# Patient Record
Sex: Male | Born: 1960
Health system: Southern US, Community
[De-identification: ages and names within clinical notes are randomized; demographics above are authoritative.]

## PROBLEM LIST (undated history)

## (undated) DIAGNOSIS — G4700 Insomnia, unspecified: Secondary | ICD-10-CM

## (undated) DIAGNOSIS — E559 Vitamin D deficiency, unspecified: Secondary | ICD-10-CM

## (undated) DIAGNOSIS — E538 Deficiency of other specified B group vitamins: Secondary | ICD-10-CM

## (undated) DIAGNOSIS — I10 Essential (primary) hypertension: Secondary | ICD-10-CM

## (undated) DIAGNOSIS — K219 Gastro-esophageal reflux disease without esophagitis: Secondary | ICD-10-CM

## (undated) HISTORY — PX: BACK SURGERY: SHX140

## (undated) HISTORY — DX: Gastro-esophageal reflux disease without esophagitis: K21.9

## (undated) HISTORY — DX: Vitamin D deficiency, unspecified: E55.9

## (undated) HISTORY — DX: Insomnia, unspecified: G47.00

## (undated) HISTORY — DX: Essential (primary) hypertension: I10

## (undated) HISTORY — DX: Deficiency of other specified B group vitamins: E53.8

---

## 2003-12-07 ENCOUNTER — Ambulatory Visit: Payer: Self-pay | Admitting: Family Medicine

## 2005-02-26 ENCOUNTER — Ambulatory Visit: Payer: Self-pay | Admitting: Internal Medicine

## 2005-03-01 ENCOUNTER — Ambulatory Visit: Payer: Self-pay | Admitting: Internal Medicine

## 2006-03-15 ENCOUNTER — Ambulatory Visit: Payer: Self-pay | Admitting: Internal Medicine

## 2006-07-13 ENCOUNTER — Ambulatory Visit: Payer: Self-pay | Admitting: Internal Medicine

## 2006-07-25 ENCOUNTER — Ambulatory Visit: Payer: Self-pay | Admitting: Internal Medicine

## 2006-07-25 LAB — CONVERTED CEMR LAB
ALT: 20 units/L (ref 0–53)
AST: 21 units/L (ref 0–37)
Basophils Relative: 0.8 % (ref 0.0–1.0)
Bilirubin, Direct: 0.1 mg/dL (ref 0.0–0.3)
Calcium: 9.1 mg/dL (ref 8.4–10.5)
Chloride: 101 meq/L (ref 96–112)
Cholesterol: 195 mg/dL (ref 0–200)
Creatinine, Ser: 1 mg/dL (ref 0.4–1.5)
Eosinophils Absolute: 0.1 10*3/uL (ref 0.0–0.6)
Eosinophils Relative: 1.9 % (ref 0.0–5.0)
GFR calc non Af Amer: 86 mL/min
Glucose, Bld: 96 mg/dL (ref 70–99)
HCT: 44.7 % (ref 39.0–52.0)
Ketones, ur: NEGATIVE mg/dL
LDL Cholesterol: 142 mg/dL — ABNORMAL HIGH (ref 0–99)
MCV: 88.6 fL (ref 78.0–100.0)
Neutrophils Relative %: 46.1 % (ref 43.0–77.0)
Nitrite: NEGATIVE
Platelets: 259 10*3/uL (ref 150–400)
RBC: 5.05 M/uL (ref 4.22–5.81)
RDW: 14.2 % (ref 11.5–14.6)
Sodium: 139 meq/L (ref 135–145)
Specific Gravity, Urine: 1.02 (ref 1.000–1.03)
Total Bilirubin: 1 mg/dL (ref 0.3–1.2)
Total CHOL/HDL Ratio: 5.3
Total Protein, Urine: NEGATIVE mg/dL
Urine Glucose: NEGATIVE mg/dL
Urobilinogen, UA: 0.2 (ref 0.0–1.0)
WBC: 5.1 10*3/uL (ref 4.5–10.5)
pH: 6 (ref 5.0–8.0)

## 2006-08-21 ENCOUNTER — Emergency Department (HOSPITAL_COMMUNITY): Admission: EM | Admit: 2006-08-21 | Discharge: 2006-08-21 | Payer: Self-pay | Admitting: Emergency Medicine

## 2007-06-21 ENCOUNTER — Encounter: Payer: Self-pay | Admitting: *Deleted

## 2007-06-21 DIAGNOSIS — G4700 Insomnia, unspecified: Secondary | ICD-10-CM | POA: Insufficient documentation

## 2009-01-19 ENCOUNTER — Ambulatory Visit: Payer: Self-pay | Admitting: Internal Medicine

## 2009-01-19 DIAGNOSIS — I10 Essential (primary) hypertension: Secondary | ICD-10-CM | POA: Insufficient documentation

## 2009-01-19 DIAGNOSIS — Z87891 Personal history of nicotine dependence: Secondary | ICD-10-CM | POA: Insufficient documentation

## 2009-01-24 LAB — CONVERTED CEMR LAB
BUN: 12 mg/dL (ref 6–23)
Bilirubin Urine: NEGATIVE
Bilirubin, Direct: 0.1 mg/dL (ref 0.0–0.3)
CO2: 27 meq/L (ref 19–32)
Calcium: 9.2 mg/dL (ref 8.4–10.5)
Chloride: 106 meq/L (ref 96–112)
Cholesterol: 186 mg/dL (ref 0–200)
Creatinine, Ser: 0.9 mg/dL (ref 0.4–1.5)
Eosinophils Absolute: 0.1 10*3/uL (ref 0.0–0.7)
HDL: 39.4 mg/dL (ref >39.00)
Hemoglobin, Urine: NEGATIVE
Ketones, ur: NEGATIVE mg/dL
LDL Cholesterol: 126 mg/dL — ABNORMAL HIGH (ref 0–99)
Lymphocytes Relative: 35.1 % (ref 12.0–46.0)
MCHC: 33.3 g/dL (ref 30.0–36.0)
MCV: 92.4 fL (ref 78.0–100.0)
Monocytes Absolute: 0.6 10*3/uL (ref 0.1–1.0)
Neutrophils Relative %: 49.4 % (ref 43.0–77.0)
Platelets: 227 10*3/uL (ref 150.0–400.0)
Specific Gravity, Urine: >=1.03 (ref 1.000–1.030)
Total Bilirubin: 1 mg/dL (ref 0.3–1.2)
Triglycerides: 102 mg/dL (ref 0.0–149.0)
Urine Glucose: NEGATIVE mg/dL
Urobilinogen, UA: 0.2 (ref 0.0–1.0)
VLDL: 20.4 mg/dL (ref 0.0–40.0)
WBC: 4.6 10*3/uL (ref 4.5–10.5)

## 2009-09-05 ENCOUNTER — Telehealth: Payer: Self-pay | Admitting: Internal Medicine
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
## 2010-02-14 NOTE — Assessment & Plan Note (Signed)
Summary: cpx / uhc/#/cd   Vital Signs:  Patient profile:   50 year old male Height:      72 inches Weight:      223 pounds BMI:     30.35 Temp:     98.5 degrees F oral Pulse rate:   69 / minute BP sitting:   116 / 88  (left arm)  Vitals Entered By: Tora Perches (January 19, 2009 8:42 AM) CC: cpx Is Patient Diabetic? No Comments pt states he takes bloodpressure med but he does not no the name./vg   CC:  cpx.  History of Present Illness: The patient presents for a wellness examination. C/o occasional R shoulder pain - he sleeps on the floor  Preventive Screening-Counseling & Management  Alcohol-Tobacco     Smoking Status: quit  Caffeine-Diet-Exercise     Does Patient Exercise: no  Current Medications (verified): 1)  Multivitamins  Tabs (Multiple Vitamin) .... Once Daily  Allergies (verified): 1)  Lisinopril (Lisinopril)  Past History:  Past Medical History: INSOMNIA (ICD-780.52)   Hypertension - he has a friend  Card in [place] [facility] in 2010  Past Surgical History: Denies surgical history  Family History: HTN  Social History: Occupation: Married Regular exercise-no - tennis Former Smoker Does Patient Exercise:  no  Review of Systems  The patient denies anorexia, fever, weight loss, weight gain, vision loss, decreased hearing, hoarseness, chest pain, syncope, dyspnea on exertion, peripheral edema, prolonged cough, headaches, hemoptysis, abdominal pain, melena, hematochezia, severe indigestion/heartburn, hematuria, incontinence, genital sores, muscle weakness, suspicious skin lesions, transient blindness, difficulty walking, depression, unusual weight change, abnormal bleeding, enlarged lymph nodes, angioedema, and testicular masses.    Physical Exam  General:  Well-developed,well-nourished,in no acute distress; alert,appropriate and cooperative throughout examination Head:  Normocephalic and atraumatic without obvious abnormalities. No apparent alopecia or  balding. Eyes:  No corneal or conjunctival inflammation noted. EOMI. Perrla. Ears:  External ear exam shows no significant lesions or deformities.  Otoscopic examination reveals clear canals, tympanic membranes are intact bilaterally without bulging, retraction, inflammation or discharge. Hearing is grossly normal bilaterally. Nose:  External nasal examination shows no deformity or inflammation. Nasal mucosa are pink and moist without lesions or exudates. Mouth:  Oral mucosa and oropharynx without lesions or exudates.  Teeth in good repair. Neck:  No deformities, masses, or tenderness noted. Lungs:  Normal respiratory effort, chest expands symmetrically. Lungs are clear to auscultation, no crackles or wheezes. Heart:  Normal rate and regular rhythm. S1 and S2 normal without gallop, murmur, click, rub or other extra sounds. Abdomen:  Bowel sounds positive,abdomen soft and non-tender without masses, organomegaly or hernias noted. Genitalia:  Test nl B Msk:  No deformity or scoliosis noted of thoracic or lumbar spine.   Extremities:  No clubbing, cyanosis, edema, or deformity noted with normal full range of motion of all joints.   Neurologic:  No cranial nerve deficits noted. Station and gait are normal. Plantar reflexes are down-going bilaterally. DTRs are symmetrical throughout. Sensory, motor and coordinative functions appear intact. Skin:  Intact without suspicious lesions or rashes Cervical Nodes:  No lymphadenopathy noted Inguinal Nodes:  No significant adenopathy Psych:  Cognition and judgment appear intact. Alert and cooperative with normal attention span and concentration. No apparent delusions, illusions, hallucinations   Impression & Recommendations:  Problem # 1:  Preventive Health Care (ICD-V70.0) Assessment Comment Only Health and age related issues were discussed. Available screening tests and vaccinations were discussed as well. Healthy life style including good diet and  execise  was discussed. The labs were reviewed with the patient.  Problem # 2:  HYPERTENSION (ICD-401.9) Assessment: Improved  His updated medication list for this problem includes:    Amlodipine Besylate 5 Mg Tabs (Amlodipine besylate) .Marland Kitchen... 1 by mouth qd  Problem # 3:  INSOMNIA (ICD-780.52) Assessment: Improved  The following medications were removed from the medication list:    Ambien 10 Mg Tabs (Zolpidem tartrate) .Marland Kitchen... Take 1/2 at bedtime as needed  Complete Medication List: 1)  Multivitamins Tabs (Multiple vitamin) .... Once daily 2)  Amlodipine Besylate 5 Mg Tabs (Amlodipine besylate) .Marland Kitchen.. 1 by mouth qd 3)  Aspirin 81 Mg Tbec (Aspirin) .Marland Kitchen.. 1 by mouth qd 4)  Vitamin D 1000 Unit Tabs (Cholecalciferol) .Marland Kitchen.. 1 by mouth qd  Other Orders: Admin 1st Vaccine (16109) Flu Vaccine 25yrs + (60454)  Patient Instructions: 1)  Please schedule a follow-up appointment in 1 year well w/labs. 2)  Memory foam matress pad and a contour pillow 3)  Use stretching exercises that I have provided (15 min. or longer every day) Prescriptions: AMLODIPINE BESYLATE 5 MG TABS (AMLODIPINE BESYLATE) 1 by mouth qd  #30 x 12   Entered and Authorized by:   Tresa Garter MD   Signed by:   Tora Perches on 01/19/2009   Method used:   Print then Give to Patient   RxID:   [id number]    Influenza Vaccine (to be given today)    Flu Vaccine Consent Questions     Do you have a history of severe allergic reactions to this vaccine? no    Any prior history of allergic reactions to egg and/or gelatin? no    Do you have a sensitivity to the preservative Thimersol? no    Do you have a past history of Guillan-Barre Syndrome? no    Do you currently have an acute febrile illness? no    Have you ever had a severe reaction to latex? no    Vaccine information given and explained to patient? yes    Are you currently pregnant? no    Lot Number:[id number]   Exp Date:07/14/2009   Site Given  Left Deltoid IMbflu

## 2010-05-30 NOTE — Assessment & Plan Note (Signed)
[facility]                           PRIMARY CARE OFFICE NOTE   NOBLE, CICALESE                      MRN:          [id number]  DATE:07/25/2006                            DOB:          1960-07-30    The patient is a 50 year old male who presents for a wellness  examination.   PAST MEDICAL HISTORY:  As per March 01, 2005 note.   FAMILY HISTORY:  As per March 01, 2005 note.   SOCIAL HISTORY:  As per March 01, 2005 note.   CURRENT MEDICATIONS:  None.   ALLERGIES:  None.   REVIEW OF SYSTEMS:  No chest pain or shortness of breath. No syncope. No  neurologic complaints. He has been stressed out lately with job  insecurity and others. Not depressed. The rest of 18 point review of  system is negative.   PHYSICAL EXAMINATION:  He looks well. Blood pressure 148/101, recheck  163/100. He is in no acute distress. Looks well.  HEENT: With moist mucosa. Neck is supple. No thyromegaly or bruit.  LUNGS:  Clear to auscultation and percussion. No wheezing or rales.  HEART: S1, S2. No murmur. No gallop.  ABDOMEN: Soft and nontender. No organomegaly or mass felt.  EXTREMITIES: Lower extremities without edema.  NEURO: He is alert and cooperative. Denies being depressed.  Normal external genitalia. No hernias, testicles free of masses.   LABORATORY DATA:  EKG with normal sinus rhythm.   ASSESSMENT/PLAN:  1. Normal wellness examination.Age/health-related issues discussed.      Healthy lifestyle discussed. Obtain lab work appropriate for age.      Stress reduction discussed. Repeat examination in 12 months.  2. Stress. Discussed. He is managing okay.  3. Insomnia related to the above. I gave him Ambien 10 mg at h.s. one-      half to one p.r.n. Risks and benefits discussed.     Georgina Quint. Plotnikov, MD  Electronically Signed   AVP/MedQ  DD: 07/30/2006  DT: 07/30/2006  Job #: [id number]

## 2010-07-13 ENCOUNTER — Telehealth: Payer: Self-pay

## 2010-07-17 ENCOUNTER — Telehealth: Payer: Self-pay | Admitting: Internal Medicine

## 2010-07-17 ENCOUNTER — Ambulatory Visit (INDEPENDENT_AMBULATORY_CARE_PROVIDER_SITE_OTHER)
Admission: RE | Admit: 2010-07-17 | Discharge: 2010-07-17 | Disposition: A | Discharge status: Home / Self Care | Payer: 59 | Source: Ambulatory Visit | Attending: Internal Medicine | Admitting: Internal Medicine

## 2010-07-17 ENCOUNTER — Ambulatory Visit (INDEPENDENT_AMBULATORY_CARE_PROVIDER_SITE_OTHER): Payer: 59 | Admitting: Internal Medicine

## 2010-07-17 ENCOUNTER — Encounter: Payer: Self-pay | Admitting: Internal Medicine

## 2010-07-17 VITALS — BP 140/90 | HR 72 | Temp 97.8°F | Resp 16 | Ht 71.5 in | Wt 225.0 lb

## 2010-07-17 DIAGNOSIS — M545 Low back pain, unspecified: Secondary | ICD-10-CM

## 2010-07-17 DIAGNOSIS — M79605 Pain in left leg: Secondary | ICD-10-CM

## 2010-07-17 MED ORDER — PREDNISONE 10 MG PO TABS: ORAL_TABLET | ORAL | Status: AC

## 2010-07-17 MED ORDER — HYDROCODONE-ACETAMINOPHEN 7.5-325 MG PO TABS: 1.0000 | ORAL_TABLET | Freq: Four times a day (QID) | ORAL | Status: AC | PRN

## 2010-07-17 NOTE — Telephone Encounter (Signed)
Andrew Rowland, please, inform patient that lower spine xray normal except for DJD Thx

## 2010-07-17 NOTE — Progress Notes (Signed)
  Subjective:    Patient ID: Andrew Rowland, male    DOB: [birth year], 50 y.o.   MRN: [id number]  HPI  C/o LBP severe x 3 wks 6-7/10; worse w/activity - B sides He took Skelaxin and Relafen - it helped. 3 d ago pain started to go down L leg to mid-calf. No rash   Review of Systems  Constitutional: Negative for appetite change, fatigue and unexpected weight change.  HENT: Negative for nosebleeds, congestion, sore throat, sneezing, trouble swallowing and neck pain.   Eyes: Negative for itching and visual disturbance.  Respiratory: Negative for cough.   Cardiovascular: Negative for chest pain, palpitations and leg swelling.  Gastrointestinal: Negative for nausea, diarrhea, blood in stool and abdominal distention.  Genitourinary: Negative for frequency, hematuria and decreased urine volume.  Musculoskeletal: Positive for back pain and gait problem. Negative for myalgias, joint swelling and arthralgias.  Skin: Negative for rash.  Neurological: Negative for dizziness, tremors, speech difficulty and weakness.  Psychiatric/Behavioral: Negative for sleep disturbance, dysphoric mood and agitation. The patient is not nervous/anxious.        Objective:   Physical Exam  Constitutional: He is oriented to person, place, and time. He appears well-developed.  HENT:  Mouth/Throat: Oropharynx is clear and moist.  Eyes: Conjunctivae are normal. Pupils are equal, round, and reactive to light.  Neck: Normal range of motion. No JVD present. No thyromegaly present.  Cardiovascular: Normal rate, regular rhythm, normal heart sounds and intact distal pulses.  Exam reveals no gallop and no friction rub.   No murmur heard. Pulmonary/Chest: Effort normal and breath sounds normal. No respiratory distress. He has no wheezes. He has no rales. He exhibits no tenderness.  Abdominal: Soft. Bowel sounds are normal. He exhibits no distension and no mass. There is no tenderness. There is no rebound and no guarding.    Musculoskeletal: Normal range of motion. He exhibits tenderness (LS is tender L>R). He exhibits no edema.  Lymphadenopathy:    He has no cervical adenopathy.  Neurological: He is alert and oriented to person, place, and time. He has normal reflexes. He displays normal reflexes. No cranial nerve deficit. He exhibits normal muscle tone. Coordination normal.       Str leg elev is pos on L  Skin: Skin is warm and dry. No rash noted.  Psychiatric: He has a normal mood and affect. His behavior is normal. Judgment and thought content normal.          Assessment & Plan:

## 2010-07-17 NOTE — Patient Instructions (Signed)
Stretch your lower back

## 2010-07-18 NOTE — Telephone Encounter (Signed)
Pt informed

## 2010-07-20 NOTE — Telephone Encounter (Signed)
error 

## 2010-07-24 ENCOUNTER — Encounter: Payer: Self-pay | Admitting: Internal Medicine

## 2010-07-24 DIAGNOSIS — M79605 Pain in left leg: Secondary | ICD-10-CM | POA: Insufficient documentation

## 2010-07-24 DIAGNOSIS — M545 Low back pain, unspecified: Secondary | ICD-10-CM | POA: Insufficient documentation

## 2010-07-24 DIAGNOSIS — M5416 Radiculopathy, lumbar region: Secondary | ICD-10-CM | POA: Insufficient documentation

## 2010-07-24 NOTE — Assessment & Plan Note (Signed)
Severe. Prednisone 10 mg: take 4 tabs a day x 3 days; then 3 tabs a day x 4 days; then 2 tabs a day x 4 days, then 1 tab a day x 6 days, then stop. Take pc. See other meds and orders

## 2010-08-09 ENCOUNTER — Encounter: Payer: Self-pay | Admitting: Internal Medicine

## 2010-08-09 ENCOUNTER — Ambulatory Visit (INDEPENDENT_AMBULATORY_CARE_PROVIDER_SITE_OTHER): Payer: 59 | Admitting: Internal Medicine

## 2010-08-09 VITALS — BP 140/108 | HR 96 | Temp 98.4°F | Resp 16 | Ht 71.5 in | Wt 221.0 lb

## 2010-08-09 DIAGNOSIS — I10 Essential (primary) hypertension: Secondary | ICD-10-CM

## 2010-08-09 DIAGNOSIS — R519 Headache, unspecified: Secondary | ICD-10-CM | POA: Insufficient documentation

## 2010-08-09 DIAGNOSIS — R51 Headache: Secondary | ICD-10-CM

## 2010-08-09 DIAGNOSIS — M545 Low back pain, unspecified: Secondary | ICD-10-CM

## 2010-08-09 DIAGNOSIS — M79605 Pain in left leg: Secondary | ICD-10-CM

## 2010-08-09 DIAGNOSIS — Z0000 Encounter for general adult medical examination without abnormal findings: Secondary | ICD-10-CM

## 2010-08-09 MED ORDER — AMLODIPINE BESYLATE-VALSARTAN 5-160 MG PO TABS: 1.0000 | ORAL_TABLET | Freq: Every day | ORAL | Status: DC

## 2010-08-09 NOTE — Progress Notes (Signed)
  Subjective:    Patient ID: Andrew Rowland, male    DOB: [birth year], 50 y.o.   MRN: [id number]  HPI  F/u LBP much better F/u elev BP He had a HA - dull x 2 d  Review of Systems  Constitutional: Negative for appetite change, fatigue and unexpected weight change.  HENT: Negative for nosebleeds, congestion, sore throat, sneezing, trouble swallowing and neck pain.   Eyes: Negative for itching and visual disturbance.  Respiratory: Negative for cough.   Cardiovascular: Negative for chest pain, palpitations and leg swelling.  Gastrointestinal: Negative for nausea, diarrhea, blood in stool and abdominal distention.  Genitourinary: Negative for frequency and hematuria.  Musculoskeletal: Positive for back pain (much better). Negative for joint swelling and gait problem.  Skin: Negative for rash.  Neurological: Negative for dizziness, tremors, speech difficulty and weakness.  Psychiatric/Behavioral: Negative for sleep disturbance, dysphoric mood and agitation. The patient is not nervous/anxious.        Objective:   Physical Exam  Constitutional: He is oriented to person, place, and time. He appears well-developed.  HENT:  Mouth/Throat: Oropharynx is clear and moist.  Eyes: Conjunctivae are normal. Pupils are equal, round, and reactive to light.  Neck: Normal range of motion. No JVD present. No thyromegaly present.  Cardiovascular: Normal rate, regular rhythm, normal heart sounds and intact distal pulses.  Exam reveals no gallop and no friction rub.   No murmur heard. Pulmonary/Chest: Effort normal and breath sounds normal. No respiratory distress. He has no wheezes. He has no rales. He exhibits no tenderness.  Abdominal: Soft. Bowel sounds are normal. He exhibits no distension and no mass. There is no tenderness. There is no rebound and no guarding.  Musculoskeletal: Normal range of motion. He exhibits no edema and no tenderness.  Lymphadenopathy:    He has no cervical adenopathy.    Neurological: He is alert and oriented to person, place, and time. He has normal reflexes. No cranial nerve deficit. He exhibits normal muscle tone. Coordination normal.  Skin: Skin is warm and dry. No rash noted.  Psychiatric: He has a normal mood and affect. His behavior is normal. Judgment and thought content normal.          Assessment & Plan:

## 2010-08-09 NOTE — Assessment & Plan Note (Signed)
Change to Exforge if elevated

## 2010-08-09 NOTE — Assessment & Plan Note (Signed)
New saddle for bike MRI if re-occurs Glucosamine to try

## 2010-08-09 NOTE — Patient Instructions (Addendum)
If BP > 140/95 start Exforge 160/5 a day

## 2010-08-09 NOTE — Assessment & Plan Note (Signed)
See meds 

## 2010-09-30 ENCOUNTER — Encounter: Payer: Self-pay | Admitting: Family Medicine

## 2010-09-30 ENCOUNTER — Ambulatory Visit (INDEPENDENT_AMBULATORY_CARE_PROVIDER_SITE_OTHER): Payer: 59 | Admitting: Family Medicine

## 2010-09-30 VITALS — BP 110/78 | HR 68 | Temp 98.7°F | Wt 216.8 lb

## 2010-09-30 DIAGNOSIS — M545 Low back pain, unspecified: Secondary | ICD-10-CM

## 2010-09-30 MED ORDER — PREDNISONE 10 MG PO TABS: ORAL_TABLET | ORAL | Status: DC

## 2010-09-30 NOTE — Progress Notes (Signed)
  Subjective:    Patient ID: Andrew Rowland, male    DOB: 04/26/1960, 50 y.o.   MRN: [id number]  HPI Recurrent low back pain. Was doing well until 2 days ago when he tried new exercise program. Now has recurrent left lower back pain radiating down left lower extremity. No incontinence. No weakness. Pain is moderate. Using hydrocodone with some relief. Previously prescribed prednisone which seemed to help. Previous x-rays revealed degenerative changes mostly L5-S1.   Review of Systems  Constitutional: Negative for fever, appetite change and unexpected weight change.  Cardiovascular: Negative for chest pain.  Gastrointestinal: Negative for abdominal pain.  Genitourinary: Negative for dysuria.  Neurological: Negative for weakness and numbness.       Objective:   Physical Exam  Constitutional: He appears well-developed and well-nourished.  Cardiovascular: Normal rate and regular rhythm.   Pulmonary/Chest: Effort normal and breath sounds normal. No respiratory distress. He has no wheezes. He has no rales.  Musculoskeletal: He exhibits no edema.       Straight leg raise is negative. No leg edema  Neurological:       Deep tendon reflexes 2+ knee and ankle bilaterally. Normal sensory function. Strength full lower extremities          Assessment & Plan:  Recurrent low back pain with left radiculopathy symptoms. Nonfocal neurologic exam. Prescription for repeat prednisone taper. Continue Vicodin as needed for pain relief. Consider physical therapy. Patient may need MRI to further assess. Followup with primary if symptoms persist

## 2010-09-30 NOTE — Patient Instructions (Signed)
Consider physical therapy Followup with primary physician if pain persists

## 2011-04-04 ENCOUNTER — Encounter: Payer: Self-pay | Admitting: Internal Medicine

## 2011-04-04 ENCOUNTER — Ambulatory Visit (INDEPENDENT_AMBULATORY_CARE_PROVIDER_SITE_OTHER)
Admission: RE | Admit: 2011-04-04 | Discharge: 2011-04-04 | Disposition: A | Discharge status: Home / Self Care | Payer: 59 | Source: Ambulatory Visit | Attending: Internal Medicine | Admitting: Internal Medicine

## 2011-04-04 ENCOUNTER — Ambulatory Visit (INDEPENDENT_AMBULATORY_CARE_PROVIDER_SITE_OTHER): Payer: 59 | Admitting: Internal Medicine

## 2011-04-04 ENCOUNTER — Other Ambulatory Visit (INDEPENDENT_AMBULATORY_CARE_PROVIDER_SITE_OTHER): Payer: 59

## 2011-04-04 ENCOUNTER — Telehealth: Payer: Self-pay | Admitting: Internal Medicine

## 2011-04-04 VITALS — BP 130/90 | HR 80 | Temp 98.8°F | Resp 16 | Wt 217.0 lb

## 2011-04-04 DIAGNOSIS — M25473 Effusion, unspecified ankle: Secondary | ICD-10-CM

## 2011-04-04 DIAGNOSIS — M25471 Effusion, right ankle: Secondary | ICD-10-CM

## 2011-04-04 DIAGNOSIS — M545 Low back pain, unspecified: Secondary | ICD-10-CM

## 2011-04-04 DIAGNOSIS — I10 Essential (primary) hypertension: Secondary | ICD-10-CM

## 2011-04-04 DIAGNOSIS — M79605 Pain in left leg: Secondary | ICD-10-CM

## 2011-04-04 DIAGNOSIS — M25579 Pain in unspecified ankle and joints of unspecified foot: Secondary | ICD-10-CM

## 2011-04-04 LAB — BASIC METABOLIC PANEL
BUN: 16 mg/dL (ref 6–23)
CO2: 29 mEq/L (ref 19–32)
Chloride: 106 mEq/L (ref 96–112)
GFR: 87.68 mL/min (ref >60.00)
Glucose, Bld: 90 mg/dL (ref 70–99)
Potassium: 4.6 mEq/L (ref 3.5–5.1)
Sodium: 140 mEq/L (ref 135–145)

## 2011-04-04 LAB — SEDIMENTATION RATE: Sed Rate: 4 mm/hr (ref 0–22)

## 2011-04-04 MED ORDER — MELOXICAM 15 MG PO TABS: 15.0000 mg | ORAL_TABLET | Freq: Every day | ORAL | Status: DC | PRN

## 2011-04-04 MED ORDER — AMLODIPINE BESYLATE-VALSARTAN 5-160 MG PO TABS: 1.0000 | ORAL_TABLET | Freq: Every day | ORAL | Status: DC

## 2011-04-04 NOTE — Progress Notes (Signed)
Patient ID: Andrew Rowland, male   DOB: [birth year], 51 y.o.   MRN: [id number]  Subjective:    Patient ID: Andrew Rowland, male    DOB: 1960/11/14, 51 y.o.   MRN: [id number]  HPI  C/o elev BP lately C/o R ankle swelling x weeks or months, mild pain. No injury  BP Readings from Last 3 Encounters:  04/04/11 130/90  09/30/10 110/78  08/09/10 140/108   Wt Readings from Last 3 Encounters:  04/04/11 217 lb (98.431 kg)  09/30/10 216 lb 12 oz (98.317 kg)  08/09/10 221 lb (100.245 kg)      Review of Systems  Constitutional: Negative for appetite change, fatigue and unexpected weight change.  HENT: Negative for nosebleeds, congestion, sore throat, sneezing, trouble swallowing and neck pain.   Eyes: Negative for itching and visual disturbance.  Respiratory: Negative for cough.   Cardiovascular: Negative for chest pain, palpitations and leg swelling.  Gastrointestinal: Negative for nausea, diarrhea, blood in stool and abdominal distention.  Genitourinary: Negative for frequency and hematuria.  Musculoskeletal: Positive for back pain (much better). Negative for joint swelling and gait problem.  Skin: Negative for rash.  Neurological: Negative for dizziness, tremors, speech difficulty and weakness.  Psychiatric/Behavioral: Negative for sleep disturbance, dysphoric mood and agitation. The patient is not nervous/anxious.   R ankle is swollen and a little tender      Objective:   Physical Exam  Constitutional: He is oriented to person, place, and time. He appears well-developed.  HENT:  Mouth/Throat: Oropharynx is clear and moist.  Eyes: Conjunctivae are normal. Pupils are equal, round, and reactive to light.  Neck: Normal range of motion. No JVD present. No thyromegaly present.  Cardiovascular: Normal rate, regular rhythm, normal heart sounds and intact distal pulses.  Exam reveals no gallop and no friction rub.   No murmur heard. Pulmonary/Chest: Effort normal and breath sounds  normal. No respiratory distress. He has no wheezes. He has no rales. He exhibits no tenderness.  Abdominal: Soft. Bowel sounds are normal. He exhibits no distension and no mass. There is no tenderness. There is no rebound and no guarding.  Musculoskeletal: Normal range of motion. He exhibits no edema and no tenderness.  Lymphadenopathy:    He has no cervical adenopathy.  Neurological: He is alert and oriented to person, place, and time. He has normal reflexes. No cranial nerve deficit. He exhibits normal muscle tone. Coordination normal.  Skin: Skin is warm and dry. No rash noted.  Psychiatric: He has a normal mood and affect. His behavior is normal. Judgment and thought content normal.  R ankle is swollen and a little tender        Assessment & Plan:

## 2011-04-04 NOTE — Assessment & Plan Note (Signed)
Much better 

## 2011-04-04 NOTE — Assessment & Plan Note (Signed)
Xray Labs ACE Meloxicam

## 2011-04-04 NOTE — Assessment & Plan Note (Signed)
Switch back to Exforge (from Norvasc)

## 2011-04-04 NOTE — Telephone Encounter (Signed)
Andrew Rowland, please, inform patient that all labs/xray are normal  Thx

## 2011-04-05 LAB — RHEUMATOID FACTOR: Rhuematoid fact SerPl-aCnc: <10 IU/mL (ref <=14)

## 2011-04-05 MED ORDER — VALSARTAN 160 MG PO TABS: 160.0000 mg | ORAL_TABLET | Freq: Every day | ORAL | Status: DC

## 2011-04-05 MED ORDER — AMLODIPINE BESYLATE 5 MG PO TABS: 5.0000 mg | ORAL_TABLET | Freq: Every day | ORAL | Status: DC

## 2011-04-05 NOTE — Telephone Encounter (Signed)
Yes: Amlod 5; Diovan 160 Stacey, pls mail Rx Thx

## 2011-04-05 NOTE — Telephone Encounter (Signed)
Pt informed. Copies mailed to him.   He states Exforge is too expensive. He states he has enough samples for 2 mo. He wants to know if he can take 2 meds separately to be similar to Exforge. He already has Rx for amlodipine. Please advise.

## 2011-04-05 NOTE — Telephone Encounter (Signed)
Left detailed mess informing pt of below. Rxs mailed to pt.

## 2011-05-02 ENCOUNTER — Ambulatory Visit (INDEPENDENT_AMBULATORY_CARE_PROVIDER_SITE_OTHER): Payer: 59 | Admitting: Internal Medicine

## 2011-05-02 ENCOUNTER — Encounter: Payer: Self-pay | Admitting: Internal Medicine

## 2011-05-02 VITALS — BP 130/86 | HR 84 | Temp 97.6°F | Resp 16 | Wt 218.0 lb

## 2011-05-02 DIAGNOSIS — M545 Low back pain, unspecified: Secondary | ICD-10-CM

## 2011-05-02 DIAGNOSIS — I10 Essential (primary) hypertension: Secondary | ICD-10-CM

## 2011-05-02 DIAGNOSIS — M25471 Effusion, right ankle: Secondary | ICD-10-CM

## 2011-05-02 DIAGNOSIS — M25473 Effusion, unspecified ankle: Secondary | ICD-10-CM

## 2011-05-02 DIAGNOSIS — R51 Headache: Secondary | ICD-10-CM

## 2011-05-02 DIAGNOSIS — I872 Venous insufficiency (chronic) (peripheral): Secondary | ICD-10-CM | POA: Insufficient documentation

## 2011-05-02 NOTE — Assessment & Plan Note (Signed)
Compression socks and aspirin for travel Call if problems

## 2011-05-02 NOTE — Assessment & Plan Note (Signed)
Resolved

## 2011-05-02 NOTE — Progress Notes (Signed)
Patient ID: GANON DEMASI, male   DOB: 09-02-1960, 51 y.o.   MRN: [id number] Patient ID: AVIS MCMAHILL, male   DOB: 1960-10-13, 51 y.o.   MRN: [id number]  Subjective:    Patient ID: Boykin Nearing, male    DOB: November 26, 1960, 51 y.o.   MRN: [id number]  HPI  F/u elev BP, HA F/u  R ankle swelling x weeks or months, mild pain - resolved. No injury  BP Readings from Last 3 Encounters:  05/02/11 130/86  04/04/11 130/90  09/30/10 110/78   Wt Readings from Last 3 Encounters:  05/02/11 218 lb (98.884 kg)  04/04/11 217 lb (98.431 kg)  09/30/10 216 lb 12 oz (98.317 kg)      Review of Systems  Constitutional: Negative for appetite change, fatigue and unexpected weight change.  HENT: Negative for nosebleeds, congestion, sore throat, sneezing, trouble swallowing and neck pain.   Eyes: Negative for itching and visual disturbance.  Respiratory: Negative for cough.   Cardiovascular: Negative for chest pain, palpitations and leg swelling.  Gastrointestinal: Negative for nausea, diarrhea, blood in stool and abdominal distention.  Genitourinary: Negative for frequency and hematuria.  Musculoskeletal: Negative for back pain, joint swelling and gait problem.  Skin: Negative for rash.  Neurological: Negative for dizziness, tremors, speech difficulty and weakness.  Psychiatric/Behavioral: Negative for sleep disturbance, dysphoric mood and agitation. The patient is not nervous/anxious.         Objective:   Physical Exam  Constitutional: He is oriented to person, place, and time. He appears well-developed.  HENT:  Mouth/Throat: Oropharynx is clear and moist.  Eyes: Conjunctivae are normal. Pupils are equal, round, and reactive to light.  Neck: Normal range of motion. No JVD present. No thyromegaly present.  Cardiovascular: Normal rate, regular rhythm, normal heart sounds and intact distal pulses.  Exam reveals no gallop and no friction rub.   No murmur heard. Pulmonary/Chest: Effort normal  and breath sounds normal. No respiratory distress. He has no wheezes. He has no rales. He exhibits no tenderness.  Abdominal: Soft. Bowel sounds are normal. He exhibits no distension and no mass. There is no tenderness. There is no rebound and no guarding.  Musculoskeletal: Normal range of motion. He exhibits no edema and no tenderness.  Lymphadenopathy:    He has no cervical adenopathy.  Neurological: He is alert and oriented to person, place, and time. He has normal reflexes. No cranial nerve deficit. He exhibits normal muscle tone. Coordination normal.  Skin: Skin is warm and dry. No rash noted.  Psychiatric: He has a normal mood and affect. His behavior is normal. Judgment and thought content normal.  B ankles WNL, venous network is generous B, spider veins, slight hyperpigmentation        Assessment & Plan:

## 2011-05-02 NOTE — Assessment & Plan Note (Signed)
Continue with current prescription therapy as reflected on the Med list. Labs  

## 2011-05-02 NOTE — Patient Instructions (Signed)
Compression socks and aspirin for travel

## 2011-11-07 ENCOUNTER — Encounter: Payer: Self-pay | Admitting: Internal Medicine

## 2011-11-07 ENCOUNTER — Ambulatory Visit (INDEPENDENT_AMBULATORY_CARE_PROVIDER_SITE_OTHER): Payer: 59 | Admitting: Internal Medicine

## 2011-11-07 ENCOUNTER — Other Ambulatory Visit (INDEPENDENT_AMBULATORY_CARE_PROVIDER_SITE_OTHER): Payer: 59

## 2011-11-07 VITALS — BP 120/78 | HR 80 | Temp 98.3°F | Resp 16 | Wt 211.0 lb

## 2011-11-07 DIAGNOSIS — M25473 Effusion, unspecified ankle: Secondary | ICD-10-CM

## 2011-11-07 DIAGNOSIS — Z23 Encounter for immunization: Secondary | ICD-10-CM

## 2011-11-07 DIAGNOSIS — I872 Venous insufficiency (chronic) (peripheral): Secondary | ICD-10-CM

## 2011-11-07 DIAGNOSIS — I10 Essential (primary) hypertension: Secondary | ICD-10-CM

## 2011-11-07 DIAGNOSIS — M545 Low back pain, unspecified: Secondary | ICD-10-CM

## 2011-11-07 DIAGNOSIS — M79605 Pain in left leg: Secondary | ICD-10-CM

## 2011-11-07 DIAGNOSIS — M25471 Effusion, right ankle: Secondary | ICD-10-CM

## 2011-11-07 LAB — BASIC METABOLIC PANEL
BUN: 21 mg/dL (ref 6–23)
Creatinine, Ser: 1 mg/dL (ref 0.4–1.5)
GFR: 87.47 mL/min (ref >60.00)

## 2011-11-07 LAB — LIPID PANEL
LDL Cholesterol: 129 mg/dL — ABNORMAL HIGH (ref 0–99)
Total CHOL/HDL Ratio: 4
Triglycerides: 38 mg/dL (ref 0.0–149.0)

## 2011-11-07 MED ORDER — AMLODIPINE BESYLATE-VALSARTAN 5-160 MG PO TABS: 1.0000 | ORAL_TABLET | Freq: Every day | ORAL | Status: DC

## 2011-11-07 NOTE — Assessment & Plan Note (Signed)
Better  

## 2011-11-07 NOTE — Assessment & Plan Note (Signed)
MSK pain Stretch LS/hips Sports med cons if needed

## 2011-11-07 NOTE — Assessment & Plan Note (Signed)
BP Readings from Last 3 Encounters:  11/07/11 120/78  05/02/11 130/86  04/04/11 130/90

## 2011-11-07 NOTE — Progress Notes (Signed)
   Subjective:    Patient ID: Andrew Rowland, male    DOB: Jul 16, 1960, 51 y.o.   MRN: [id number]  HPI  F/u elev BP, HA F/u  R ankle swelling x weeks or months, mild pain - resolved. No injury  BP Readings from Last 3 Encounters:  11/07/11 120/78  05/02/11 130/86  04/04/11 130/90   Wt Readings from Last 3 Encounters:  11/07/11 211 lb (95.709 kg)  05/02/11 218 lb (98.884 kg)  04/04/11 217 lb (98.431 kg)      Review of Systems  Constitutional: Negative for appetite change, fatigue and unexpected weight change.  HENT: Negative for nosebleeds, congestion, sore throat, sneezing, trouble swallowing and neck pain.   Eyes: Negative for itching and visual disturbance.  Respiratory: Negative for cough.   Cardiovascular: Negative for chest pain, palpitations and leg swelling.  Gastrointestinal: Negative for nausea, diarrhea, blood in stool and abdominal distention.  Genitourinary: Negative for frequency and hematuria.  Musculoskeletal: Negative for back pain, joint swelling and gait problem.  Skin: Negative for rash.  Neurological: Negative for dizziness, tremors, speech difficulty and weakness.  Psychiatric/Behavioral: Negative for disturbed wake/sleep cycle, dysphoric mood and agitation. The patient is not nervous/anxious.         Objective:   Physical Exam  Constitutional: He is oriented to person, place, and time. He appears well-developed.  HENT:  Mouth/Throat: Oropharynx is clear and moist.  Eyes: Conjunctivae normal are normal. Pupils are equal, round, and reactive to light.  Neck: Normal range of motion. No JVD present. No thyromegaly present.  Cardiovascular: Normal rate, regular rhythm, normal heart sounds and intact distal pulses.  Exam reveals no gallop and no friction rub.   No murmur heard. Pulmonary/Chest: Effort normal and breath sounds normal. No respiratory distress. He has no wheezes. He has no rales. He exhibits no tenderness.  Abdominal: Soft. Bowel sounds  are normal. He exhibits no distension and no mass. There is no tenderness. There is no rebound and no guarding.  Musculoskeletal: Normal range of motion. He exhibits no edema and no tenderness.  Lymphadenopathy:    He has no cervical adenopathy.  Neurological: He is alert and oriented to person, place, and time. He has normal reflexes. No cranial nerve deficit. He exhibits normal muscle tone. Coordination normal.  Skin: Skin is warm and dry. No rash noted.  Psychiatric: He has a normal mood and affect. His behavior is normal. Judgment and thought content normal.  B ankles WNL, venous network is generous B, spider veins, slight hyperpigmentation        Assessment & Plan:

## 2011-11-07 NOTE — Patient Instructions (Addendum)
BP Readings from Last 3 Encounters:  11/07/11 120/78  05/02/11 130/86  04/04/11 130/90   Wt Readings from Last 3 Encounters:  11/07/11 211 lb (95.709 kg)  05/02/11 218 lb (98.884 kg)  04/04/11 217 lb (98.431 kg)   Hip openers IT band stretches

## 2011-11-12 ENCOUNTER — Telehealth: Payer: Self-pay | Admitting: *Deleted

## 2011-11-12 NOTE — Telephone Encounter (Signed)
Left detailed mess informing pt of below.  

## 2011-11-12 NOTE — Telephone Encounter (Signed)
Message copied by Merrilyn Puma on Mon Nov 12, 2011  2:06 PM ------      Message from: Tresa Garter      Created: Thu Nov 08, 2011  1:39 PM       Misty Stanley, please, inform patient that all labs are OK      Thank you!

## 2012-04-16 ENCOUNTER — Other Ambulatory Visit: Payer: Self-pay | Admitting: Internal Medicine

## 2012-04-17 ENCOUNTER — Telehealth: Payer: Self-pay | Admitting: Internal Medicine

## 2012-04-17 NOTE — Telephone Encounter (Addendum)
OK to fill this prescription with additional refills x1 Thank you!  

## 2012-04-17 NOTE — Telephone Encounter (Signed)
Patient Information:  Caller Name: Andrew Rowland  Phone: [phone]  Patient: Andrew Rowland, Andrew Rowland  Gender: Male  DOB: [birth year]  Age: 52 Years  PCP: Plotnikov, Alex (Adults only)  Office Follow Up:  Does the office need to follow up with this patient?: Yes  Instructions For The Office: Please call in refills as requested and notify patient of same when this is done; he states pharmacy does not notify him when Rx is ready.  Thank you.   Symptoms  Reason For Call & Symptoms: Patient calling requesting medication refilled until his next scheduled appointment 05/09/12.  Amlodipine 5 mg and Valsartan 160 mg each taken daily requested; takes last tablets 04/17/12 and he requests 1 month refill.  Pharmacy information confirmed in Epic.  Thank you.  Reviewed Health History In EMR: Yes  Reviewed Medications In EMR: Yes  Reviewed Allergies In EMR: Yes  Reviewed Surgeries / Procedures: Yes  Date of Onset of Symptoms: Unknown  Guideline(s) Used:  No Protocol Available - Information Only  Disposition Per Guideline:   Discuss with PCP and Callback by Nurse Today  Reason For Disposition Reached:   Nursing judgment  Advice Given:  Call Back If:  New symptoms develop  You become worse.  Patient Will Follow Care Advice:  YES

## 2012-04-18 MED ORDER — VALSARTAN 160 MG PO TABS: 160.0000 mg | ORAL_TABLET | Freq: Every day | ORAL | Status: DC

## 2012-04-18 MED ORDER — AMLODIPINE BESYLATE 5 MG PO TABS: 5.0000 mg | ORAL_TABLET | Freq: Every day | ORAL | Status: DC

## 2012-04-18 NOTE — Telephone Encounter (Signed)
Notified pt meds sent to pharmacy.../lmb 

## 2012-05-09 ENCOUNTER — Encounter: Payer: Self-pay | Admitting: Internal Medicine

## 2012-05-09 ENCOUNTER — Other Ambulatory Visit (INDEPENDENT_AMBULATORY_CARE_PROVIDER_SITE_OTHER): Payer: 59

## 2012-05-09 ENCOUNTER — Ambulatory Visit (INDEPENDENT_AMBULATORY_CARE_PROVIDER_SITE_OTHER): Payer: 59 | Admitting: Internal Medicine

## 2012-05-09 VITALS — BP 110/68 | HR 76 | Temp 98.4°F | Resp 16 | Ht 72.0 in | Wt 210.0 lb

## 2012-05-09 DIAGNOSIS — Z0000 Encounter for general adult medical examination without abnormal findings: Secondary | ICD-10-CM

## 2012-05-09 DIAGNOSIS — Z1211 Encounter for screening for malignant neoplasm of colon: Secondary | ICD-10-CM

## 2012-05-09 DIAGNOSIS — Z23 Encounter for immunization: Secondary | ICD-10-CM

## 2012-05-09 DIAGNOSIS — I10 Essential (primary) hypertension: Secondary | ICD-10-CM

## 2012-05-09 DIAGNOSIS — M25471 Effusion, right ankle: Secondary | ICD-10-CM

## 2012-05-09 LAB — CBC WITH DIFFERENTIAL/PLATELET
Basophils Relative: 0.9 % (ref 0.0–3.0)
Eosinophils Relative: 2.7 % (ref 0.0–5.0)
HCT: 43.5 % (ref 39.0–52.0)
Lymphs Abs: 1.7 10*3/uL (ref 0.7–4.0)
MCV: 92.1 fl (ref 78.0–100.0)
Monocytes Absolute: 0.5 10*3/uL (ref 0.1–1.0)
Neutrophils Relative %: 53.8 % (ref 43.0–77.0)
RBC: 4.72 Mil/uL (ref 4.22–5.81)
WBC: 5.2 10*3/uL (ref 4.5–10.5)

## 2012-05-09 LAB — HEPATIC FUNCTION PANEL
Bilirubin, Direct: 0 mg/dL (ref 0.0–0.3)
Total Bilirubin: 0.5 mg/dL (ref 0.3–1.2)

## 2012-05-09 LAB — URINALYSIS
Specific Gravity, Urine: 1.01 (ref 1.000–1.030)
Total Protein, Urine: NEGATIVE
Urine Glucose: NEGATIVE

## 2012-05-09 LAB — BASIC METABOLIC PANEL
CO2: 27 mEq/L (ref 19–32)
Calcium: 9 mg/dL (ref 8.4–10.5)
Creatinine, Ser: 0.9 mg/dL (ref 0.4–1.5)
Potassium: 4.3 mEq/L (ref 3.5–5.1)

## 2012-05-09 LAB — TSH: TSH: 0.67 u[IU]/mL (ref 0.35–5.50)

## 2012-05-09 MED ORDER — AMLODIPINE BESYLATE 5 MG PO TABS: 5.0000 mg | ORAL_TABLET | Freq: Every day | ORAL | Status: DC

## 2012-05-09 MED ORDER — VALSARTAN 160 MG PO TABS: 160.0000 mg | ORAL_TABLET | Freq: Every day | ORAL | Status: DC

## 2012-05-09 NOTE — Progress Notes (Signed)
   Subjective:     HPI The patient is here for a wellness exam. The patient has been doing well overall without major physical or psychological issues going on lately.  F/u elev BP, HA  F/u  R ankle swelling x months, mild pain - resolved after chiropractic treatment. No injury  BP Readings from Last 3 Encounters:  05/09/12 110/68  11/07/11 120/78  05/02/11 130/86   Wt Readings from Last 3 Encounters:  05/09/12 210 lb (95.255 kg)  11/07/11 211 lb (95.709 kg)  05/02/11 218 lb (98.884 kg)      Review of Systems  Constitutional: Negative for appetite change, fatigue and unexpected weight change.  HENT: Negative for nosebleeds, congestion, sore throat, sneezing, trouble swallowing and neck pain.   Eyes: Negative for itching and visual disturbance.  Respiratory: Negative for cough.   Cardiovascular: Negative for chest pain, palpitations and leg swelling.  Gastrointestinal: Negative for nausea, diarrhea, blood in stool and abdominal distention.  Genitourinary: Negative for frequency and hematuria.  Musculoskeletal: Negative for back pain, joint swelling and gait problem.  Skin: Negative for rash.  Neurological: Negative for dizziness, tremors, speech difficulty and weakness.  Psychiatric/Behavioral: Negative for sleep disturbance, dysphoric mood and agitation. The patient is not nervous/anxious.         Objective:   Physical Exam  Constitutional: He is oriented to person, place, and time. He appears well-developed.  HENT:  Mouth/Throat: Oropharynx is clear and moist.  Eyes: Conjunctivae are normal. Pupils are equal, round, and reactive to light.  Neck: Normal range of motion. No JVD present. No thyromegaly present.  Cardiovascular: Normal rate, regular rhythm, normal heart sounds and intact distal pulses.  Exam reveals no gallop and no friction rub.   No murmur heard. Pulmonary/Chest: Effort normal and breath sounds normal. No respiratory distress. He has no wheezes. He has  no rales. He exhibits no tenderness.  Abdominal: Soft. Bowel sounds are normal. He exhibits no distension and no mass. There is no tenderness. There is no rebound and no guarding.  Genitourinary:  Rect, prostate per Dr Chales Abrahams  Musculoskeletal: Normal range of motion. He exhibits no edema and no tenderness.  Lymphadenopathy:    He has no cervical adenopathy.  Neurological: He is alert and oriented to person, place, and time. He has normal reflexes. No cranial nerve deficit. He exhibits normal muscle tone. Coordination normal.  Skin: Skin is warm and dry. No rash noted.  Psychiatric: He has a normal mood and affect. His behavior is normal. Judgment and thought content normal.  B ankles WNL, venous network is generous B, spider veins, slight hyperpigmentation         Assessment & Plan:

## 2012-05-09 NOTE — Assessment & Plan Note (Signed)
Better  

## 2012-05-09 NOTE — Assessment & Plan Note (Signed)
Continue with current prescription therapy as reflected on the Med list.  

## 2012-05-09 NOTE — Assessment & Plan Note (Signed)
We discussed age appropriate health related issues, including available/recomended screening tests and vaccinations. We discussed a need for adhering to healthy diet and exercise. Labs/EKG were reviewed/ordered. All questions were answered.  tDAP Colonoscopy

## 2012-06-13 HISTORY — PX: COLONOSCOPY: SHX174

## 2012-11-20 ENCOUNTER — Other Ambulatory Visit: Payer: Self-pay

## 2013-01-22 ENCOUNTER — Ambulatory Visit (INDEPENDENT_AMBULATORY_CARE_PROVIDER_SITE_OTHER)
Admission: RE | Admit: 2013-01-22 | Discharge: 2013-01-22 | Disposition: A | Discharge status: Home / Self Care | Payer: 59 | Source: Ambulatory Visit | Attending: Internal Medicine | Admitting: Internal Medicine

## 2013-01-22 ENCOUNTER — Encounter: Payer: Self-pay | Admitting: Internal Medicine

## 2013-01-22 ENCOUNTER — Ambulatory Visit (INDEPENDENT_AMBULATORY_CARE_PROVIDER_SITE_OTHER): Payer: 59 | Admitting: Internal Medicine

## 2013-01-22 VITALS — BP 138/74 | HR 88 | Temp 99.3°F | Wt 217.2 lb

## 2013-01-22 DIAGNOSIS — J209 Acute bronchitis, unspecified: Secondary | ICD-10-CM

## 2013-01-22 DIAGNOSIS — J45909 Unspecified asthma, uncomplicated: Secondary | ICD-10-CM | POA: Insufficient documentation

## 2013-01-22 DIAGNOSIS — H6690 Otitis media, unspecified, unspecified ear: Secondary | ICD-10-CM

## 2013-01-22 DIAGNOSIS — H6692 Otitis media, unspecified, left ear: Secondary | ICD-10-CM | POA: Insufficient documentation

## 2013-01-22 DIAGNOSIS — I10 Essential (primary) hypertension: Secondary | ICD-10-CM

## 2013-01-22 MED ORDER — BENZONATATE 200 MG PO CAPS: 200.0000 mg | ORAL_CAPSULE | Freq: Two times a day (BID) | ORAL | Status: DC | PRN

## 2013-01-22 MED ORDER — PROMETHAZINE-CODEINE 6.25-10 MG/5ML PO SYRP: 5.0000 mL | ORAL_SOLUTION | ORAL | Status: DC | PRN

## 2013-01-22 MED ORDER — FLUTICASONE FUROATE-VILANTEROL 100-25 MCG/INH IN AEPB: 1.0000 | INHALATION_SPRAY | Freq: Every day | RESPIRATORY_TRACT | Status: DC

## 2013-01-22 MED ORDER — LEVOFLOXACIN 500 MG PO TABS: 500.0000 mg | ORAL_TABLET | Freq: Every day | ORAL | Status: DC

## 2013-01-22 NOTE — Assessment & Plan Note (Addendum)
Breo Levaquin Prom-cod syr CXR

## 2013-01-22 NOTE — Patient Instructions (Signed)
Use over-the-counter  "cold" medicines  such as  "Afrin" nasal spray for nasal congestion as directed instead. Use" Delsym" or" Robitussin" cough syrup varietis for cough.  You can use plain "Tylenol" or "Advil" for fever, chills and achyness.  Please, make an appointment if you are not better or if you're worse.  

## 2013-01-22 NOTE — Assessment & Plan Note (Signed)
Continue with current prescription therapy as reflected on the Med list.  

## 2013-01-22 NOTE — Progress Notes (Signed)
   Subjective:    Patient ID: Andrew Rowland, male    DOB: 07-11-1960, 53 y.o.   MRN: [id number]  Cough This is a new problem. The current episode started in the past 7 days. The problem has been gradually worsening. The problem occurs every few minutes. The cough is non-productive. Associated symptoms include chest pain, chills, ear congestion and a fever. The treatment provided no relief. There is no history of asthma.      Review of Systems  Constitutional: Positive for fever and chills.  Respiratory: Positive for cough.   Cardiovascular: Positive for chest pain.       Objective:   Physical Exam  Constitutional: He is oriented to person, place, and time. He appears well-developed. No distress.  NAD  HENT:  Right Ear: External ear normal.  Mouth/Throat: Oropharynx is clear and moist.  L TM is red eryth throat barking cough  Eyes: Conjunctivae are normal. Pupils are equal, round, and reactive to light.  Neck: Normal range of motion. No JVD present. No thyromegaly present.  Cardiovascular: Normal rate, regular rhythm, normal heart sounds and intact distal pulses.  Exam reveals no gallop and no friction rub.   No murmur heard. Pulmonary/Chest: Effort normal and breath sounds normal. No respiratory distress. He has no wheezes. He has no rales. He exhibits no tenderness.  Abdominal: Soft. Bowel sounds are normal. He exhibits no distension and no mass. There is no tenderness. There is no rebound and no guarding.  Musculoskeletal: Normal range of motion. He exhibits no edema and no tenderness.  Lymphadenopathy:    He has no cervical adenopathy.  Neurological: He is alert and oriented to person, place, and time. He has normal reflexes. No cranial nerve deficit. He exhibits normal muscle tone. He displays a negative Romberg sign. Coordination and gait normal.  No meningeal signs  Skin: Skin is warm and dry. No rash noted.  Psychiatric: He has a normal mood and affect. His behavior is  normal. Judgment and thought content normal.          Assessment & Plan:

## 2013-01-22 NOTE — Assessment & Plan Note (Signed)
Po abx 

## 2013-01-22 NOTE — Progress Notes (Signed)
Pre-visit discussion using our clinic review tool. No additional management support is needed unless otherwise documented below in the visit note.  

## 2013-05-11 ENCOUNTER — Other Ambulatory Visit (INDEPENDENT_AMBULATORY_CARE_PROVIDER_SITE_OTHER): Payer: 59

## 2013-05-11 ENCOUNTER — Ambulatory Visit (INDEPENDENT_AMBULATORY_CARE_PROVIDER_SITE_OTHER): Payer: 59 | Admitting: Internal Medicine

## 2013-05-11 ENCOUNTER — Encounter: Payer: Self-pay | Admitting: Internal Medicine

## 2013-05-11 VITALS — BP 100/70 | HR 80 | Temp 98.9°F | Resp 16 | Ht 72.0 in | Wt 217.0 lb

## 2013-05-11 DIAGNOSIS — Z0000 Encounter for general adult medical examination without abnormal findings: Secondary | ICD-10-CM

## 2013-05-11 DIAGNOSIS — J45990 Exercise induced bronchospasm: Secondary | ICD-10-CM

## 2013-05-11 DIAGNOSIS — I10 Essential (primary) hypertension: Secondary | ICD-10-CM

## 2013-05-11 LAB — CBC WITH DIFFERENTIAL/PLATELET
Basophils Absolute: 0.1 10*3/uL (ref 0.0–0.1)
Basophils Relative: 1.4 % (ref 0.0–3.0)
EOS ABS: 0.5 10*3/uL (ref 0.0–0.7)
Eosinophils Relative: 8.9 % — ABNORMAL HIGH (ref 0.0–5.0)
HCT: 45.4 % (ref 39.0–52.0)
Hemoglobin: 15 g/dL (ref 13.0–17.0)
Lymphocytes Relative: 35.2 % (ref 12.0–46.0)
Lymphs Abs: 1.9 10*3/uL (ref 0.7–4.0)
MCHC: 33.2 g/dL (ref 30.0–36.0)
MCV: 93.6 fl (ref 78.0–100.0)
Monocytes Absolute: 0.6 10*3/uL (ref 0.1–1.0)
Monocytes Relative: 10.6 % (ref 3.0–12.0)
NEUTROS PCT: 43.9 % (ref 43.0–77.0)
Neutro Abs: 2.4 10*3/uL (ref 1.4–7.7)
Platelets: 242 10*3/uL (ref 150.0–400.0)
RBC: 4.85 Mil/uL (ref 4.22–5.81)
RDW: 14.7 % — ABNORMAL HIGH (ref 11.5–14.6)
WBC: 5.5 10*3/uL (ref 4.5–10.5)

## 2013-05-11 LAB — BASIC METABOLIC PANEL
BUN: 12 mg/dL (ref 6–23)
CO2: 27 mEq/L (ref 19–32)
CREATININE: 0.8 mg/dL (ref 0.4–1.5)
Calcium: 9.2 mg/dL (ref 8.4–10.5)
Chloride: 106 mEq/L (ref 96–112)
GFR: 104.32 mL/min (ref >60.00)
Glucose, Bld: 93 mg/dL (ref 70–99)
Potassium: 4.2 mEq/L (ref 3.5–5.1)
Sodium: 139 mEq/L (ref 135–145)

## 2013-05-11 LAB — LIPID PANEL
CHOL/HDL RATIO: 4
Cholesterol: 182 mg/dL (ref 0–200)
HDL: 46.2 mg/dL (ref >39.00)
LDL Cholesterol: 124 mg/dL — ABNORMAL HIGH (ref 0–99)
TRIGLYCERIDES: 57 mg/dL (ref 0.0–149.0)
VLDL: 11.4 mg/dL (ref 0.0–40.0)

## 2013-05-11 LAB — URINALYSIS
BILIRUBIN URINE: NEGATIVE
HGB URINE DIPSTICK: NEGATIVE
Ketones, ur: NEGATIVE
Leukocytes, UA: NEGATIVE
Nitrite: NEGATIVE
Specific Gravity, Urine: 1.015 (ref 1.000–1.030)
Total Protein, Urine: NEGATIVE
URINE GLUCOSE: NEGATIVE
UROBILINOGEN UA: 0.2 (ref 0.0–1.0)
pH: 6 (ref 5.0–8.0)

## 2013-05-11 LAB — HEPATIC FUNCTION PANEL
ALT: 17 U/L (ref 0–53)
AST: 17 U/L (ref 0–37)
Albumin: 4.2 g/dL (ref 3.5–5.2)
Alkaline Phosphatase: 52 U/L (ref 39–117)
Bilirubin, Direct: 0.1 mg/dL (ref 0.0–0.3)
Total Bilirubin: 0.6 mg/dL (ref 0.3–1.2)
Total Protein: 6.9 g/dL (ref 6.0–8.3)

## 2013-05-11 LAB — PSA: PSA: 1.18 ng/mL (ref 0.10–4.00)

## 2013-05-11 LAB — TSH: TSH: 2.4 u[IU]/mL (ref 0.35–5.50)

## 2013-05-11 MED ORDER — VALSARTAN 160 MG PO TABS: 160.0000 mg | ORAL_TABLET | Freq: Every day | ORAL | Status: DC

## 2013-05-11 MED ORDER — PROAIR HFA 108 (90 BASE) MCG/ACT IN AERS: 2.0000 | INHALATION_SPRAY | Freq: Four times a day (QID) | RESPIRATORY_TRACT | Status: DC | PRN

## 2013-05-11 MED ORDER — AMLODIPINE BESYLATE 5 MG PO TABS: 5.0000 mg | ORAL_TABLET | Freq: Every day | ORAL | Status: DC

## 2013-05-11 MED ORDER — VITAMIN D 1000 UNITS PO TABS: 1000.0000 [IU] | ORAL_TABLET | Freq: Every day | ORAL | Status: AC

## 2013-05-11 NOTE — Progress Notes (Signed)
   Subjective:     HPI  The patient is here for a wellness exam. The patient has been doing well overall. C/o pain between shoulder blades in the office. C/o cough after running >3 mi  F/u elev BP  He had a nl colon 2014 Dr Lyndel Safe  BP Readings from Last 3 Encounters:  05/11/13 138/100  01/22/13 138/74  05/09/12 110/68   Wt Readings from Last 3 Encounters:  05/11/13 217 lb (98.431 kg)  01/22/13 217 lb 4 oz (98.544 kg)  05/09/12 210 lb (95.255 kg)      Review of Systems  Constitutional: Negative for appetite change, fatigue and unexpected weight change.  HENT: Negative for congestion, nosebleeds, sneezing, sore throat and trouble swallowing.   Eyes: Negative for itching and visual disturbance.  Respiratory: Negative for cough.   Cardiovascular: Negative for chest pain, palpitations and leg swelling.  Gastrointestinal: Negative for nausea, diarrhea, blood in stool and abdominal distention.  Genitourinary: Negative for frequency and hematuria.  Musculoskeletal: Negative for back pain, gait problem, joint swelling and neck pain.  Skin: Negative for rash.  Neurological: Negative for dizziness, tremors, speech difficulty and weakness.  Psychiatric/Behavioral: Negative for sleep disturbance, dysphoric mood and agitation. The patient is not nervous/anxious.   Rectal per Dr Lyndel Safe      Objective:   Physical Exam  Constitutional: He is oriented to person, place, and time. He appears well-developed.  HENT:  Mouth/Throat: Oropharynx is clear and moist.  Eyes: Conjunctivae are normal. Pupils are equal, round, and reactive to light.  Neck: Normal range of motion. No JVD present. No thyromegaly present.  Cardiovascular: Normal rate, regular rhythm, normal heart sounds and intact distal pulses.  Exam reveals no gallop and no friction rub.   No murmur heard. Pulmonary/Chest: Effort normal and breath sounds normal. No respiratory distress. He has no wheezes. He has no rales. He exhibits  no tenderness.  Abdominal: Soft. Bowel sounds are normal. He exhibits no distension and no mass. There is no tenderness. There is no rebound and no guarding.  Genitourinary:  Rect, prostate per Dr Lyndel Safe  Musculoskeletal: Normal range of motion. He exhibits no edema and no tenderness.  Lymphadenopathy:    He has no cervical adenopathy.  Neurological: He is alert and oriented to person, place, and time. He has normal reflexes. No cranial nerve deficit. He exhibits normal muscle tone. Coordination normal.  Skin: Skin is warm and dry. No rash noted.  Psychiatric: He has a normal mood and affect. His behavior is normal. Judgment and thought content normal.  B ankles WNL, venous network is generous B, spider veins, slight hyperpigmentation      Rectal per Dr Lyndel Safe  Lab Results  Component Value Date   WBC 5.2 05/09/2012   HGB 14.8 05/09/2012   HCT 43.5 05/09/2012   PLT 261.0 05/09/2012   GLUCOSE 94 05/09/2012   CHOL 178 11/07/2011   TRIG 38.0 11/07/2011   HDL 41.60 11/07/2011   LDLCALC 129* 11/07/2011   ALT 15 05/09/2012   AST 16 05/09/2012   NA 137 05/09/2012   K 4.3 05/09/2012   CL 103 05/09/2012   CREATININE 0.9 05/09/2012   BUN 14 05/09/2012   CO2 27 05/09/2012   TSH 0.67 05/09/2012     Assessment & Plan:

## 2013-05-11 NOTE — Progress Notes (Signed)
Pre visit review using our clinic review tool, if applicable. No additional management support is needed unless otherwise documented below in the visit note. 

## 2013-05-11 NOTE — Assessment & Plan Note (Signed)
Continue with current prescription therapy as reflected on the Med list.  

## 2013-05-11 NOTE — Assessment & Plan Note (Addendum)
We discussed age appropriate health related issues, including available/recomended screening tests and vaccinations. We discussed a need for adhering to healthy diet and exercise. Labs/EKG were reviewed/ordered. All questions were answered. He had a nl colon 2014 Dr Lyndel Safe

## 2013-05-12 ENCOUNTER — Telehealth: Payer: Self-pay | Admitting: Internal Medicine

## 2013-05-12 NOTE — Telephone Encounter (Signed)
Relevant patient education assigned to patient using Emmi. ° °

## 2013-06-01 ENCOUNTER — Other Ambulatory Visit: Payer: Self-pay | Admitting: Internal Medicine

## 2013-06-13 ENCOUNTER — Other Ambulatory Visit: Payer: Self-pay | Admitting: Internal Medicine

## 2013-06-16 ENCOUNTER — Other Ambulatory Visit: Payer: Self-pay | Admitting: *Deleted

## 2013-06-16 MED ORDER — AMLODIPINE BESYLATE 5 MG PO TABS: 5.0000 mg | ORAL_TABLET | Freq: Every day | ORAL | Status: DC

## 2013-10-30 ENCOUNTER — Other Ambulatory Visit: Payer: Self-pay

## 2014-05-28 ENCOUNTER — Other Ambulatory Visit: Payer: Self-pay | Admitting: Internal Medicine

## 2014-06-12 ENCOUNTER — Other Ambulatory Visit: Payer: Self-pay | Admitting: Internal Medicine

## 2014-06-22 ENCOUNTER — Telehealth: Payer: Self-pay | Admitting: *Deleted

## 2014-06-22 DIAGNOSIS — Z125 Encounter for screening for malignant neoplasm of prostate: Secondary | ICD-10-CM

## 2014-06-22 DIAGNOSIS — Z0000 Encounter for general adult medical examination without abnormal findings: Secondary | ICD-10-CM

## 2014-06-22 NOTE — Telephone Encounter (Signed)
Entered cpx labs...lmb 

## 2014-06-22 NOTE — Telephone Encounter (Signed)
-----   Message from Ian Malkin sent at 06/18/2014  3:55 PM EDT ----- Done. Please place CPE lab orders so he can come in the last week of June to get lab work,  ----- Message -----    From: Cresenciano Lick, CMA    Sent: 06/18/2014  10:33 AM      To: Frederico Hamman Pruitt  Pt needs OV. I sent his BP med refill for a 90 day supply on 06/15/14. He needs to be seen within nxt 90 days for further refills. Thanks!

## 2014-07-16 ENCOUNTER — Encounter: Payer: Self-pay | Admitting: Internal Medicine

## 2014-07-16 ENCOUNTER — Other Ambulatory Visit (INDEPENDENT_AMBULATORY_CARE_PROVIDER_SITE_OTHER): Payer: BLUE CROSS/BLUE SHIELD

## 2014-07-16 ENCOUNTER — Ambulatory Visit (INDEPENDENT_AMBULATORY_CARE_PROVIDER_SITE_OTHER): Payer: BLUE CROSS/BLUE SHIELD | Admitting: Internal Medicine

## 2014-07-16 VITALS — BP 102/80 | HR 67 | Ht 72.0 in | Wt 219.0 lb

## 2014-07-16 DIAGNOSIS — N400 Enlarged prostate without lower urinary tract symptoms: Secondary | ICD-10-CM

## 2014-07-16 DIAGNOSIS — E559 Vitamin D deficiency, unspecified: Secondary | ICD-10-CM

## 2014-07-16 DIAGNOSIS — Z0189 Encounter for other specified special examinations: Secondary | ICD-10-CM

## 2014-07-16 DIAGNOSIS — Z0000 Encounter for general adult medical examination without abnormal findings: Secondary | ICD-10-CM

## 2014-07-16 DIAGNOSIS — R252 Cramp and spasm: Secondary | ICD-10-CM

## 2014-07-16 DIAGNOSIS — Z125 Encounter for screening for malignant neoplasm of prostate: Secondary | ICD-10-CM | POA: Diagnosis not present

## 2014-07-16 DIAGNOSIS — E538 Deficiency of other specified B group vitamins: Secondary | ICD-10-CM | POA: Insufficient documentation

## 2014-07-16 DIAGNOSIS — I10 Essential (primary) hypertension: Secondary | ICD-10-CM | POA: Diagnosis not present

## 2014-07-16 LAB — LIPID PANEL
CHOL/HDL RATIO: 4
Cholesterol: 167 mg/dL (ref 0–200)
HDL: 43.1 mg/dL (ref >39.00)
LDL Cholesterol: 113 mg/dL — ABNORMAL HIGH (ref 0–99)
NONHDL: 123.9
TRIGLYCERIDES: 55 mg/dL (ref 0.0–149.0)
VLDL: 11 mg/dL (ref 0.0–40.0)

## 2014-07-16 LAB — CBC WITH DIFFERENTIAL/PLATELET
BASOS ABS: 0 10*3/uL (ref 0.0–0.1)
Basophils Relative: 0.7 % (ref 0.0–3.0)
Eosinophils Absolute: 0.1 10*3/uL (ref 0.0–0.7)
Eosinophils Relative: 2.7 % (ref 0.0–5.0)
HCT: 46.6 % (ref 39.0–52.0)
Hemoglobin: 15.6 g/dL (ref 13.0–17.0)
Lymphocytes Relative: 42.2 % (ref 12.0–46.0)
Lymphs Abs: 2.3 10*3/uL (ref 0.7–4.0)
MCHC: 33.5 g/dL (ref 30.0–36.0)
MCV: 93.1 fl (ref 78.0–100.0)
MONOS PCT: 11.9 % (ref 3.0–12.0)
Monocytes Absolute: 0.7 10*3/uL (ref 0.1–1.0)
NEUTROS ABS: 2.3 10*3/uL (ref 1.4–7.7)
Neutrophils Relative %: 42.5 % — ABNORMAL LOW (ref 43.0–77.0)
PLATELETS: 252 10*3/uL (ref 150.0–400.0)
RBC: 5.01 Mil/uL (ref 4.22–5.81)
RDW: 13.9 % (ref 11.5–15.5)
WBC: 5.5 10*3/uL (ref 4.0–10.5)

## 2014-07-16 LAB — URINALYSIS, ROUTINE W REFLEX MICROSCOPIC
Bilirubin Urine: NEGATIVE
Hgb urine dipstick: NEGATIVE
Ketones, ur: NEGATIVE
Leukocytes, UA: NEGATIVE
Nitrite: NEGATIVE
PH: 6.5 (ref 5.0–8.0)
RBC / HPF: NONE SEEN (ref 0–?)
SPECIFIC GRAVITY, URINE: 1.015 (ref 1.000–1.030)
Total Protein, Urine: NEGATIVE
URINE GLUCOSE: NEGATIVE
Urobilinogen, UA: 0.2 (ref 0.0–1.0)
WBC, UA: NONE SEEN (ref 0–?)

## 2014-07-16 LAB — BASIC METABOLIC PANEL
BUN: 10 mg/dL (ref 6–23)
CALCIUM: 9.5 mg/dL (ref 8.4–10.5)
CO2: 28 mEq/L (ref 19–32)
Chloride: 105 mEq/L (ref 96–112)
Creatinine, Ser: 1.05 mg/dL (ref 0.40–1.50)
GFR: 78.07 mL/min (ref >60.00)
GLUCOSE: 95 mg/dL (ref 70–99)
POTASSIUM: 5.2 meq/L — AB (ref 3.5–5.1)
Sodium: 141 mEq/L (ref 135–145)

## 2014-07-16 LAB — HEPATIC FUNCTION PANEL
ALBUMIN: 4.2 g/dL (ref 3.5–5.2)
ALT: 15 U/L (ref 0–53)
AST: 17 U/L (ref 0–37)
Alkaline Phosphatase: 63 U/L (ref 39–117)
BILIRUBIN DIRECT: 0.1 mg/dL (ref 0.0–0.3)
BILIRUBIN TOTAL: 0.5 mg/dL (ref 0.2–1.2)
Total Protein: 6.9 g/dL (ref 6.0–8.3)

## 2014-07-16 LAB — VITAMIN D 25 HYDROXY (VIT D DEFICIENCY, FRACTURES): VITD: 13.63 ng/mL — ABNORMAL LOW (ref 30.00–100.00)

## 2014-07-16 LAB — TSH: TSH: 1.98 u[IU]/mL (ref 0.35–4.50)

## 2014-07-16 LAB — VITAMIN B12: Vitamin B-12: 77 pg/mL — ABNORMAL LOW (ref 211–911)

## 2014-07-16 LAB — PSA: PSA: 1.26 ng/mL (ref 0.10–4.00)

## 2014-07-16 MED ORDER — ERGOCALCIFEROL 1.25 MG (50000 UT) PO CAPS: 50000.0000 [IU] | ORAL_CAPSULE | ORAL | Status: DC

## 2014-07-16 MED ORDER — AMLODIPINE BESYLATE 5 MG PO TABS: 5.0000 mg | ORAL_TABLET | Freq: Every day | ORAL | Status: DC

## 2014-07-16 MED ORDER — VALSARTAN 160 MG PO TABS: 160.0000 mg | ORAL_TABLET | Freq: Every day | ORAL | Status: DC

## 2014-07-16 MED ORDER — VITAMIN D3 50 MCG (2000 UT) PO CAPS: 2000.0000 [IU] | ORAL_CAPSULE | Freq: Every day | ORAL | Status: DC

## 2014-07-16 MED ORDER — VITAMIN B-12 1000 MCG SL SUBL: 1.0000 | SUBLINGUAL_TABLET | Freq: Every day | SUBLINGUAL | Status: DC

## 2014-07-16 MED ORDER — VITAMIN D 1000 UNITS PO TABS: 1000.0000 [IU] | ORAL_TABLET | Freq: Every day | ORAL | Status: DC

## 2014-07-16 NOTE — Assessment & Plan Note (Signed)
Start on shots next week Start SL Vit B12 now GI f/u w/Dr Lyndel Safe
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
## 2014-07-16 NOTE — Assessment & Plan Note (Signed)
start Vit D prescription 50000 iu weekly (Rx emailed to your pharmacy) followed by over-the-counter Vit D 2000 iu daily.  

## 2014-07-16 NOTE — Assessment & Plan Note (Signed)
Try Saw [employer]

## 2014-07-16 NOTE — Patient Instructions (Signed)
You can try [employer] ( for prostate).

## 2014-07-16 NOTE — Progress Notes (Signed)
Pre visit review using our clinic review tool, if applicable. No additional management support is needed unless otherwise documented below in the visit note. 

## 2014-07-16 NOTE — Assessment & Plan Note (Signed)
Chronic On Valsartan, Amlodipine

## 2014-07-20 ENCOUNTER — Telehealth: Payer: Self-pay | Admitting: Internal Medicine

## 2014-07-20 NOTE — Telephone Encounter (Signed)
Pt called and wanted to know if he can be scd to have b12 injection ??   [phone]

## 2014-07-20 NOTE — Telephone Encounter (Signed)
OV scheduled 07/30/14.

## 2014-07-22 NOTE — Telephone Encounter (Signed)
Patient would like to know do he need this 9 o clock appt tomorrow. [phone]

## 2014-07-23 ENCOUNTER — Ambulatory Visit: Payer: BLUE CROSS/BLUE SHIELD

## 2014-07-30 ENCOUNTER — Ambulatory Visit (INDEPENDENT_AMBULATORY_CARE_PROVIDER_SITE_OTHER): Payer: BLUE CROSS/BLUE SHIELD | Admitting: Internal Medicine

## 2014-07-30 VITALS — BP 120/82 | HR 69 | Ht 72.0 in | Wt 221.0 lb

## 2014-07-30 DIAGNOSIS — E559 Vitamin D deficiency, unspecified: Secondary | ICD-10-CM

## 2014-07-30 DIAGNOSIS — I10 Essential (primary) hypertension: Secondary | ICD-10-CM | POA: Diagnosis not present

## 2014-07-30 DIAGNOSIS — E538 Deficiency of other specified B group vitamins: Secondary | ICD-10-CM

## 2014-07-30 MED ORDER — CYANOCOBALAMIN 1000 MCG/ML IJ SOLN
1000.0000 ug | Freq: Once | INTRAMUSCULAR | Status: AC
  Administered 2014-07-30: 1000 ug via SUBCUTANEOUS

## 2014-07-30 NOTE — Assessment & Plan Note (Signed)
On Vit D 

## 2014-07-30 NOTE — Progress Notes (Signed)
Pre visit review using our clinic review tool, if applicable. No additional management support is needed unless otherwise documented below in the visit note. 

## 2014-07-30 NOTE — Assessment & Plan Note (Signed)
IM shot B12 today RTC in 3 mo

## 2014-07-30 NOTE — Progress Notes (Signed)
Subjective:  Patient ID: Andrew Rowland, male    DOB: 1960-04-21  Age: 54 y.o. MRN: [id number]  CC: No chief complaint on file.   HPI Andrew Rowland presents for a new B 12 deficiency  Outpatient Prescriptions Prior to Visit  Medication Sig Dispense Refill  . amLODipine (NORVASC) 5 MG tablet Take 1 tablet (5 mg total) by mouth daily. 90 tablet 11  . aspirin 81 MG tablet Take 81 mg by mouth daily.      . Cholecalciferol (VITAMIN D3) 2000 UNITS capsule Take 1 capsule (2,000 Units total) by mouth daily. 100 capsule 3  . Cyanocobalamin (VITAMIN B-12) 1000 MCG SUBL Place 1 tablet (1,000 mcg total) under the tongue daily. 100 tablet 3  . ergocalciferol (VITAMIN D2) 50000 UNITS capsule Take 1 capsule (50,000 Units total) by mouth once a week. 6 capsule 0  . PROAIR HFA 108 (90 BASE) MCG/ACT inhaler Inhale 2 puffs into the lungs every 6 (six) hours as needed (cough due to exercise). 1 Inhaler 6  . valsartan (DIOVAN) 160 MG tablet Take 1 tablet (160 mg total) by mouth daily. 30 tablet 11   No facility-administered medications prior to visit.    ROS Review of Systems  Constitutional: Negative for fever and chills.  Respiratory: Negative for chest tightness and shortness of breath.   Neurological: Negative for dizziness, weakness and light-headedness.  Psychiatric/Behavioral: The patient is not nervous/anxious.     Objective:  BP 120/82 mmHg  Pulse 69  Ht 6' (1.829 m)  Wt 221 lb (100.245 kg)  BMI 29.97 kg/m2  SpO2 96%  BP Readings from Last 3 Encounters:  07/30/14 120/82  07/16/14 102/80  05/11/13 100/70    Wt Readings from Last 3 Encounters:  07/30/14 221 lb (100.245 kg)  07/16/14 219 lb (99.338 kg)  05/11/13 217 lb (98.431 kg)    Physical Exam  Constitutional: He is oriented to person, place, and time. He appears well-developed. No distress.  NAD  HENT:  Mouth/Throat: Oropharynx is clear and moist.  Eyes: Conjunctivae are normal. Pupils are equal, round, and reactive  to light.  Neck: Normal range of motion. No JVD present.  Cardiovascular: Normal rate, regular rhythm, normal heart sounds and intact distal pulses.   No murmur heard. Pulmonary/Chest: Effort normal and breath sounds normal. No respiratory distress. He has no rales.  Abdominal: Soft. Bowel sounds are normal. He exhibits no distension. There is no rebound.  Musculoskeletal: Normal range of motion. He exhibits no edema or tenderness.  Neurological: He is alert and oriented to person, place, and time. He has normal reflexes. He exhibits normal muscle tone. He displays a negative Romberg sign. Coordination and gait normal.  Skin: Skin is warm and dry. No rash noted.  Psychiatric: He has a normal mood and affect. His behavior is normal. Judgment and thought content normal.    Lab Results  Component Value Date   WBC 5.5 07/16/2014   HGB 15.6 07/16/2014   HCT 46.6 07/16/2014   PLT 252.0 07/16/2014   GLUCOSE 95 07/16/2014   CHOL 167 07/16/2014   TRIG 55.0 07/16/2014   HDL 43.10 07/16/2014   LDLCALC 113* 07/16/2014   ALT 15 07/16/2014   AST 17 07/16/2014   NA 141 07/16/2014   K 5.2* 07/16/2014   CL 105 07/16/2014   CREATININE 1.05 07/16/2014   BUN 10 07/16/2014   CO2 28 07/16/2014   TSH 1.98 07/16/2014   PSA 1.26 07/16/2014    Dg Chest 2 View  01/22/2013   CLINICAL DATA:  Cough.  Shortness of breath and chest pain  EXAM: CHEST  2 VIEW  COMPARISON:  None.  FINDINGS: The heart size and mediastinal contours are within normal limits. Diminished lung volumes. Both lungs are clear. The visualized skeletal structures are unremarkable.  IMPRESSION: 1. Low lung volumes.  No evidence for pneumonia.   Electronically Signed   By: Kerby Moors M.D.   On: 01/22/2013 10:15    Assessment & Plan:   There are no diagnoses linked to this encounter. I am having Mr. Lonsway maintain his aspirin, PROAIR HFA, amLODipine, valsartan, ergocalciferol, Vitamin D3, and Vitamin B-12.  No orders of the defined  types were placed in this encounter.     Follow-up: No Follow-up on file.  Walker Kehr, MD

## 2014-07-31 NOTE — Assessment & Plan Note (Signed)
Chronic On Valsartan, Amlodipine

## 2015-07-20 ENCOUNTER — Ambulatory Visit: Payer: BLUE CROSS/BLUE SHIELD | Admitting: Internal Medicine

## 2015-09-16 ENCOUNTER — Other Ambulatory Visit: Payer: Self-pay | Admitting: Internal Medicine

## 2015-10-10 ENCOUNTER — Other Ambulatory Visit: Payer: Self-pay | Admitting: Internal Medicine

## 2015-10-10 ENCOUNTER — Encounter: Payer: Self-pay | Admitting: Internal Medicine

## 2015-10-10 MED ORDER — AMLODIPINE BESYLATE 5 MG PO TABS: 5.0000 mg | ORAL_TABLET | Freq: Every day | ORAL | 0 refills | Status: DC

## 2015-10-12 ENCOUNTER — Encounter: Payer: Self-pay | Admitting: Internal Medicine

## 2015-10-13 ENCOUNTER — Other Ambulatory Visit: Payer: Self-pay | Admitting: Internal Medicine

## 2015-10-13 DIAGNOSIS — Z0000 Encounter for general adult medical examination without abnormal findings: Secondary | ICD-10-CM

## 2015-10-14 ENCOUNTER — Other Ambulatory Visit (INDEPENDENT_AMBULATORY_CARE_PROVIDER_SITE_OTHER): Payer: BLUE CROSS/BLUE SHIELD

## 2015-10-14 DIAGNOSIS — Z0000 Encounter for general adult medical examination without abnormal findings: Secondary | ICD-10-CM

## 2015-10-14 LAB — LIPID PANEL
CHOLESTEROL: 178 mg/dL (ref 0–200)
HDL: 44.7 mg/dL (ref >39.00)
LDL CALC: 115 mg/dL — AB (ref 0–99)
NonHDL: 133.63
TRIGLYCERIDES: 91 mg/dL (ref 0.0–149.0)
Total CHOL/HDL Ratio: 4
VLDL: 18.2 mg/dL (ref 0.0–40.0)

## 2015-10-14 LAB — CBC WITH DIFFERENTIAL/PLATELET
BASOS PCT: 0.6 % (ref 0.0–3.0)
Basophils Absolute: 0 10*3/uL (ref 0.0–0.1)
EOS PCT: 2.4 % (ref 0.0–5.0)
Eosinophils Absolute: 0.2 10*3/uL (ref 0.0–0.7)
HCT: 47 % (ref 39.0–52.0)
HEMOGLOBIN: 15.8 g/dL (ref 13.0–17.0)
LYMPHS ABS: 2.9 10*3/uL (ref 0.7–4.0)
Lymphocytes Relative: 43.8 % (ref 12.0–46.0)
MCHC: 33.7 g/dL (ref 30.0–36.0)
MCV: 92.3 fl (ref 78.0–100.0)
MONO ABS: 0.7 10*3/uL (ref 0.1–1.0)
Monocytes Relative: 11.2 % (ref 3.0–12.0)
NEUTROS ABS: 2.8 10*3/uL (ref 1.4–7.7)
NEUTROS PCT: 42 % — AB (ref 43.0–77.0)
PLATELETS: 232 10*3/uL (ref 150.0–400.0)
RBC: 5.1 Mil/uL (ref 4.22–5.81)
RDW: 14.1 % (ref 11.5–15.5)
WBC: 6.7 10*3/uL (ref 4.0–10.5)

## 2015-10-14 LAB — URINALYSIS
BILIRUBIN URINE: NEGATIVE
HGB URINE DIPSTICK: NEGATIVE
Ketones, ur: NEGATIVE
LEUKOCYTES UA: NEGATIVE
NITRITE: NEGATIVE
PH: 6 (ref 5.0–8.0)
Specific Gravity, Urine: <=1.005 — AB (ref 1.000–1.030)
Total Protein, Urine: NEGATIVE
Urine Glucose: NEGATIVE
Urobilinogen, UA: 0.2 (ref 0.0–1.0)

## 2015-10-14 LAB — BASIC METABOLIC PANEL
BUN: 12 mg/dL (ref 6–23)
CHLORIDE: 105 meq/L (ref 96–112)
CO2: 30 meq/L (ref 19–32)
CREATININE: 0.97 mg/dL (ref 0.40–1.50)
Calcium: 9 mg/dL (ref 8.4–10.5)
GFR: 85.16 mL/min (ref >60.00)
GLUCOSE: 90 mg/dL (ref 70–99)
POTASSIUM: 4.3 meq/L (ref 3.5–5.1)
Sodium: 141 mEq/L (ref 135–145)

## 2015-10-14 LAB — HEPATIC FUNCTION PANEL
ALT: 13 U/L (ref 0–53)
AST: 18 U/L (ref 0–37)
Albumin: 4.2 g/dL (ref 3.5–5.2)
Alkaline Phosphatase: 65 U/L (ref 39–117)
BILIRUBIN TOTAL: 0.5 mg/dL (ref 0.2–1.2)
Bilirubin, Direct: 0.1 mg/dL (ref 0.0–0.3)
Total Protein: 7.1 g/dL (ref 6.0–8.3)

## 2015-10-14 LAB — TSH: TSH: 2.88 u[IU]/mL (ref 0.35–4.50)

## 2015-10-14 LAB — VITAMIN B12: Vitamin B-12: >1500 pg/mL — ABNORMAL HIGH (ref 211–911)

## 2015-10-14 LAB — PSA: PSA: 1.12 ng/mL (ref 0.10–4.00)

## 2015-10-17 LAB — VITAMIN D 25 HYDROXY (VIT D DEFICIENCY, FRACTURES): VITD: 17.33 ng/mL — AB (ref 30.00–100.00)

## 2015-10-21 ENCOUNTER — Ambulatory Visit (INDEPENDENT_AMBULATORY_CARE_PROVIDER_SITE_OTHER): Payer: BLUE CROSS/BLUE SHIELD | Admitting: Internal Medicine

## 2015-10-21 ENCOUNTER — Encounter: Payer: Self-pay | Admitting: Internal Medicine

## 2015-10-21 DIAGNOSIS — I10 Essential (primary) hypertension: Secondary | ICD-10-CM | POA: Diagnosis not present

## 2015-10-21 DIAGNOSIS — Z0000 Encounter for general adult medical examination without abnormal findings: Secondary | ICD-10-CM | POA: Diagnosis not present

## 2015-10-21 DIAGNOSIS — E538 Deficiency of other specified B group vitamins: Secondary | ICD-10-CM

## 2015-10-21 DIAGNOSIS — E559 Vitamin D deficiency, unspecified: Secondary | ICD-10-CM

## 2015-10-21 MED ORDER — ERGOCALCIFEROL 1.25 MG (50000 UT) PO CAPS: 50000.0000 [IU] | ORAL_CAPSULE | ORAL | 0 refills | Status: DC

## 2015-10-21 MED ORDER — AMLODIPINE BESYLATE 5 MG PO TABS: 5.0000 mg | ORAL_TABLET | Freq: Every day | ORAL | 3 refills | Status: DC

## 2015-10-21 NOTE — Assessment & Plan Note (Signed)
Re-start B12 

## 2015-10-21 NOTE — Progress Notes (Signed)
Pre visit review using our clinic review tool, if applicable. No additional management support is needed unless otherwise documented below in the visit note. 

## 2015-10-21 NOTE — Assessment & Plan Note (Addendum)
Off B12 2017

## 2015-10-21 NOTE — Progress Notes (Signed)
Subjective:  Patient ID: Andrew Rowland, male    DOB: 1961/01/11  Age: 55 y.o. MRN: [id number]  CC: Annual Exam   HPI Andrew Rowland presents for well exam  Outpatient Medications Prior to Visit  Medication Sig Dispense Refill  . amLODipine (NORVASC) 5 MG tablet Take 1 tablet (5 mg total) by mouth daily. 30 tablet 0  . aspirin 81 MG tablet Take 81 mg by mouth daily.      Marland Kitchen PROAIR HFA 108 (90 BASE) MCG/ACT inhaler Inhale 2 puffs into the lungs every 6 (six) hours as needed (cough due to exercise). 1 Inhaler 6  . Cyanocobalamin (VITAMIN B-12) 1000 MCG SUBL Place 1 tablet (1,000 mcg total) under the tongue daily. 100 tablet 3  . ergocalciferol (VITAMIN D2) 50000 UNITS capsule Take 1 capsule (50,000 Units total) by mouth once a week. 6 capsule 0  . Cholecalciferol (VITAMIN D3) 2000 UNITS capsule Take 1 capsule (2,000 Units total) by mouth daily. (Patient not taking: Reported on 10/21/2015) 100 capsule 3  . valsartan (DIOVAN) 160 MG tablet Take 1 tablet (160 mg total) by mouth daily. (Patient not taking: Reported on 10/21/2015) 30 tablet 11   No facility-administered medications prior to visit.     ROS Review of Systems  Constitutional: Negative for appetite change, fatigue and unexpected weight change.  HENT: Negative for congestion, nosebleeds, sneezing, sore throat and trouble swallowing.   Eyes: Negative for itching and visual disturbance.  Respiratory: Negative for cough.   Cardiovascular: Negative for chest pain, palpitations and leg swelling.  Gastrointestinal: Negative for abdominal distention, blood in stool, diarrhea and nausea.  Genitourinary: Negative for frequency and hematuria.  Musculoskeletal: Negative for back pain, gait problem, joint swelling and neck pain.  Skin: Negative for rash.  Neurological: Negative for dizziness, tremors, speech difficulty and weakness.  Psychiatric/Behavioral: Negative for agitation, dysphoric mood and sleep disturbance. The patient is not  nervous/anxious.     Objective:  BP 110/70   Pulse 62   Ht 6' (1.829 m)   Wt 224 lb (101.6 kg)   SpO2 97%   BMI 30.38 kg/m   BP Readings from Last 3 Encounters:  10/21/15 110/70  07/30/14 120/82  07/16/14 102/80    Wt Readings from Last 3 Encounters:  10/21/15 224 lb (101.6 kg)  07/30/14 221 lb (100.2 kg)  07/16/14 219 lb (99.3 kg)    Physical Exam  Constitutional: He is oriented to person, place, and time. He appears well-developed. No distress.  NAD  HENT:  Mouth/Throat: Oropharynx is clear and moist.  Eyes: Conjunctivae are normal. Pupils are equal, round, and reactive to light.  Neck: Normal range of motion. No JVD present. No thyromegaly present.  Cardiovascular: Normal rate, regular rhythm, normal heart sounds and intact distal pulses.  Exam reveals no gallop and no friction rub.   No murmur heard. Pulmonary/Chest: Effort normal and breath sounds normal. No respiratory distress. He has no wheezes. He has no rales. He exhibits no tenderness.  Abdominal: Soft. Bowel sounds are normal. He exhibits no distension and no mass. There is no tenderness. There is no rebound and no guarding.  Genitourinary: Rectum normal and prostate normal. Rectal exam shows guaiac negative stool.  Musculoskeletal: Normal range of motion. He exhibits no edema or tenderness.  Lymphadenopathy:    He has no cervical adenopathy.  Neurological: He is alert and oriented to person, place, and time. He has normal reflexes. No cranial nerve deficit. He exhibits normal muscle tone. He displays a  negative Romberg sign. Coordination and gait normal.  Skin: Skin is warm and dry. No rash noted.  Psychiatric: He has a normal mood and affect. His behavior is normal. Judgment and thought content normal.    Lab Results  Component Value Date   WBC 6.7 10/14/2015   HGB 15.8 10/14/2015   HCT 47.0 10/14/2015   PLT 232.0 10/14/2015   GLUCOSE 90 10/14/2015   CHOL 178 10/14/2015   TRIG 91.0 10/14/2015   HDL  44.70 10/14/2015   LDLCALC 115 (H) 10/14/2015   ALT 13 10/14/2015   AST 18 10/14/2015   NA 141 10/14/2015   K 4.3 10/14/2015   CL 105 10/14/2015   CREATININE 0.97 10/14/2015   BUN 12 10/14/2015   CO2 30 10/14/2015   TSH 2.88 10/14/2015   PSA 1.12 10/14/2015    Dg Chest 2 View  Result Date: 01/22/2013 CLINICAL DATA:  Cough.  Shortness of breath and chest pain EXAM: CHEST  2 VIEW COMPARISON:  None. FINDINGS: The heart size and mediastinal contours are within normal limits. Diminished lung volumes. Both lungs are clear. The visualized skeletal structures are unremarkable. IMPRESSION: 1. Low lung volumes.  No evidence for pneumonia. Electronically Signed   By: Kerby Moors M.D.   On: 01/22/2013 10:15    Assessment & Plan:   Rhet was seen today for annual exam.  Diagnoses and all orders for this visit:  B12 deficiency  Essential hypertension  Other orders -     amLODipine (NORVASC) 5 MG tablet; Take 1 tablet (5 mg total) by mouth daily.   I have discontinued Andrew Rowland's ergocalciferol and Vitamin B-12. I am also having him maintain his aspirin, PROAIR HFA, valsartan, Vitamin D3, and amLODipine.  No orders of the defined types were placed in this encounter.    Follow-up: No Follow-up on file.  Walker Kehr, MD

## 2015-10-21 NOTE — Assessment & Plan Note (Signed)
On Valsartan, Amlodipine

## 2015-10-21 NOTE — Assessment & Plan Note (Signed)
We discussed age appropriate health related issues, including available/recomended screening tests and vaccinations. We discussed a need for adhering to healthy diet and exercise. Labs/EKG were reviewed/ordered. All questions were answered. ASA  He had a nl colon 2014 Dr Lyndel Safe

## 2015-11-18 ENCOUNTER — Other Ambulatory Visit: Payer: Self-pay | Admitting: Internal Medicine

## 2016-10-26 ENCOUNTER — Other Ambulatory Visit (INDEPENDENT_AMBULATORY_CARE_PROVIDER_SITE_OTHER): Payer: Managed Care, Other (non HMO)

## 2016-10-26 ENCOUNTER — Encounter: Payer: Self-pay | Admitting: Internal Medicine

## 2016-10-26 ENCOUNTER — Ambulatory Visit (INDEPENDENT_AMBULATORY_CARE_PROVIDER_SITE_OTHER): Payer: Managed Care, Other (non HMO) | Admitting: Internal Medicine

## 2016-10-26 VITALS — BP 122/84 | HR 62 | Temp 97.6°F | Ht 72.0 in | Wt 225.0 lb

## 2016-10-26 DIAGNOSIS — I10 Essential (primary) hypertension: Secondary | ICD-10-CM

## 2016-10-26 DIAGNOSIS — Z23 Encounter for immunization: Secondary | ICD-10-CM

## 2016-10-26 DIAGNOSIS — Z0000 Encounter for general adult medical examination without abnormal findings: Secondary | ICD-10-CM

## 2016-10-26 DIAGNOSIS — E559 Vitamin D deficiency, unspecified: Secondary | ICD-10-CM

## 2016-10-26 DIAGNOSIS — E538 Deficiency of other specified B group vitamins: Secondary | ICD-10-CM

## 2016-10-26 LAB — CBC WITH DIFFERENTIAL/PLATELET
BASOS ABS: 0.1 10*3/uL (ref 0.0–0.1)
Basophils Relative: 1.2 % (ref 0.0–3.0)
EOS ABS: 0.1 10*3/uL (ref 0.0–0.7)
Eosinophils Relative: 1.9 % (ref 0.0–5.0)
HEMATOCRIT: 47.1 % (ref 39.0–52.0)
HEMOGLOBIN: 15.9 g/dL (ref 13.0–17.0)
LYMPHS PCT: 43.6 % (ref 12.0–46.0)
Lymphs Abs: 2.3 10*3/uL (ref 0.7–4.0)
MCHC: 33.7 g/dL (ref 30.0–36.0)
MCV: 93.9 fl (ref 78.0–100.0)
MONOS PCT: 11.1 % (ref 3.0–12.0)
Monocytes Absolute: 0.6 10*3/uL (ref 0.1–1.0)
Neutro Abs: 2.2 10*3/uL (ref 1.4–7.7)
Neutrophils Relative %: 42.2 % — ABNORMAL LOW (ref 43.0–77.0)
Platelets: 243 10*3/uL (ref 150.0–400.0)
RBC: 5.01 Mil/uL (ref 4.22–5.81)
RDW: 14.1 % (ref 11.5–15.5)
WBC: 5.2 10*3/uL (ref 4.0–10.5)

## 2016-10-26 LAB — BASIC METABOLIC PANEL
BUN: 9 mg/dL (ref 6–23)
CALCIUM: 8.7 mg/dL (ref 8.4–10.5)
CO2: 29 meq/L (ref 19–32)
CREATININE: 1.04 mg/dL (ref 0.40–1.50)
Chloride: 104 mEq/L (ref 96–112)
GFR: 78.29 mL/min (ref >60.00)
Glucose, Bld: 96 mg/dL (ref 70–99)
Potassium: 4.5 mEq/L (ref 3.5–5.1)
SODIUM: 140 meq/L (ref 135–145)

## 2016-10-26 LAB — LIPID PANEL
CHOL/HDL RATIO: 4
Cholesterol: 193 mg/dL (ref 0–200)
HDL: 44.1 mg/dL (ref >39.00)
LDL Cholesterol: 132 mg/dL — ABNORMAL HIGH (ref 0–99)
NONHDL: 148.71
Triglycerides: 85 mg/dL (ref 0.0–149.0)
VLDL: 17 mg/dL (ref 0.0–40.0)

## 2016-10-26 LAB — URINALYSIS
Bilirubin Urine: NEGATIVE
Hgb urine dipstick: NEGATIVE
KETONES UR: NEGATIVE
Leukocytes, UA: NEGATIVE
Nitrite: NEGATIVE
SPECIFIC GRAVITY, URINE: 1.015 (ref 1.000–1.030)
TOTAL PROTEIN, URINE-UPE24: NEGATIVE
URINE GLUCOSE: NEGATIVE
UROBILINOGEN UA: 0.2 (ref 0.0–1.0)
pH: 7 (ref 5.0–8.0)

## 2016-10-26 LAB — PSA: PSA: 1.35 ng/mL (ref 0.10–4.00)

## 2016-10-26 LAB — VITAMIN B12: VITAMIN B 12: 256 pg/mL (ref 211–911)

## 2016-10-26 LAB — HEPATIC FUNCTION PANEL
ALBUMIN: 4.3 g/dL (ref 3.5–5.2)
ALK PHOS: 54 U/L (ref 39–117)
ALT: 13 U/L (ref 0–53)
AST: 16 U/L (ref 0–37)
BILIRUBIN DIRECT: 0.2 mg/dL (ref 0.0–0.3)
TOTAL PROTEIN: 6.8 g/dL (ref 6.0–8.3)
Total Bilirubin: 0.7 mg/dL (ref 0.2–1.2)

## 2016-10-26 LAB — VITAMIN D 25 HYDROXY (VIT D DEFICIENCY, FRACTURES): VITD: 26.67 ng/mL — ABNORMAL LOW (ref 30.00–100.00)

## 2016-10-26 LAB — TSH: TSH: 3.04 u[IU]/mL (ref 0.35–4.50)

## 2016-10-26 MED ORDER — VITAMIN B-12 1000 MCG PO TABS: 1000.0000 ug | ORAL_TABLET | Freq: Every day | ORAL | 3 refills | Status: DC

## 2016-10-26 MED ORDER — AMLODIPINE BESYLATE 5 MG PO TABS: 5.0000 mg | ORAL_TABLET | Freq: Every day | ORAL | 3 refills | Status: DC

## 2016-10-26 MED ORDER — VITAMIN D3 1.25 MG (50000 UT) PO CAPS: 1.0000 | ORAL_CAPSULE | ORAL | 0 refills | Status: DC

## 2016-10-26 NOTE — Patient Instructions (Signed)
Shingrix

## 2016-10-26 NOTE — Progress Notes (Signed)
Subjective:  Patient ID: Andrew Rowland, male    DOB: 05-18-1960  Age: 56 y.o. MRN: [id number]  CC: No chief complaint on file.   HPI Andrew Rowland presents for a well exam  Outpatient Medications Prior to Visit  Medication Sig Dispense Refill  . amLODipine (NORVASC) 5 MG tablet Take 1 tablet (5 mg total) by mouth daily. 90 tablet 3  . aspirin 81 MG tablet Take 81 mg by mouth daily.      . Cholecalciferol (VITAMIN D3) 2000 UNITS capsule Take 1 capsule (2,000 Units total) by mouth daily. 100 capsule 3  . Vitamin D, Ergocalciferol, (DRISDOL) 50000 units CAPS capsule TAKE ONE CAPSULE BY MOUTH ONCE WEEKLY 4 capsule 0   No facility-administered medications prior to visit.     ROS Review of Systems  Constitutional: Negative for appetite change, fatigue and unexpected weight change.  HENT: Negative for congestion, nosebleeds, sneezing, sore throat and trouble swallowing.   Eyes: Negative for itching and visual disturbance.  Respiratory: Negative for cough.   Cardiovascular: Negative for chest pain, palpitations and leg swelling.  Gastrointestinal: Negative for abdominal distention, blood in stool, diarrhea and nausea.  Genitourinary: Negative for frequency and hematuria.  Musculoskeletal: Negative for back pain, gait problem, joint swelling and neck pain.  Skin: Negative for rash.  Neurological: Negative for dizziness, tremors, speech difficulty and weakness.  Psychiatric/Behavioral: Negative for agitation, dysphoric mood and sleep disturbance. The patient is not nervous/anxious.     Objective:  BP 122/84 (BP Location: Left Arm, Patient Position: Sitting, Cuff Size: Large)   Pulse 62   Temp 97.6 F (36.4 C) (Oral)   Ht 6' (1.829 m)   Wt 225 lb (102.1 kg)   SpO2 100%   BMI 30.52 kg/m   BP Readings from Last 3 Encounters:  10/26/16 122/84  10/21/15 110/70  07/30/14 120/82    Wt Readings from Last 3 Encounters:  10/26/16 225 lb (102.1 kg)  10/21/15 224 lb (101.6 kg)    07/30/14 221 lb (100.2 kg)    Physical Exam  Constitutional: He is oriented to person, place, and time. He appears well-developed. No distress.  NAD  HENT:  Mouth/Throat: Oropharynx is clear and moist.  Eyes: Pupils are equal, round, and reactive to light. Conjunctivae are normal.  Neck: Normal range of motion. No JVD present. No thyromegaly present.  Cardiovascular: Normal rate, regular rhythm, normal heart sounds and intact distal pulses.  Exam reveals no gallop and no friction rub.   No murmur heard. Pulmonary/Chest: Effort normal and breath sounds normal. No respiratory distress. He has no wheezes. He has no rales. He exhibits no tenderness.  Abdominal: Soft. Bowel sounds are normal. He exhibits no distension and no mass. There is no tenderness. There is no rebound and no guarding.  Musculoskeletal: Normal range of motion. He exhibits no edema or tenderness.  Lymphadenopathy:    He has no cervical adenopathy.  Neurological: He is alert and oriented to person, place, and time. He has normal reflexes. No cranial nerve deficit. He exhibits normal muscle tone. He displays a negative Romberg sign. Coordination and gait normal.  Skin: Skin is warm and dry. No rash noted.  Psychiatric: He has a normal mood and affect. His behavior is normal. Judgment and thought content normal.   Procedure: EKG Indication: well exam, HTN Impression: S brady. No acute changes.  Lab Results  Component Value Date   WBC 6.7 10/14/2015   HGB 15.8 10/14/2015   HCT 47.0 10/14/2015  PLT 232.0 10/14/2015   GLUCOSE 90 10/14/2015   CHOL 178 10/14/2015   TRIG 91.0 10/14/2015   HDL 44.70 10/14/2015   LDLCALC 115 (H) 10/14/2015   ALT 13 10/14/2015   AST 18 10/14/2015   NA 141 10/14/2015   K 4.3 10/14/2015   CL 105 10/14/2015   CREATININE 0.97 10/14/2015   BUN 12 10/14/2015   CO2 30 10/14/2015   TSH 2.88 10/14/2015   PSA 1.12 10/14/2015    Dg Chest 2 View  Result Date: 01/22/2013 CLINICAL DATA:   Cough.  Shortness of breath and chest pain EXAM: CHEST  2 VIEW COMPARISON:  None. FINDINGS: The heart size and mediastinal contours are within normal limits. Diminished lung volumes. Both lungs are clear. The visualized skeletal structures are unremarkable. IMPRESSION: 1. Low lung volumes.  No evidence for pneumonia. Electronically Signed   By: Kerby Moors M.D.   On: 01/22/2013 10:15    Assessment & Plan:   There are no diagnoses linked to this encounter. I have discontinued Mr. Crompton's Vitamin D (Ergocalciferol). I am also having him maintain his aspirin, Vitamin D3, and amLODipine.  No orders of the defined types were placed in this encounter.    Follow-up: No Follow-up on file.  Walker Kehr, MD

## 2016-10-26 NOTE — Addendum Note (Signed)
Addended by: Karren Cobble on: 10/26/2016 01:24 PM   Modules accepted: Orders

## 2016-10-26 NOTE — Assessment & Plan Note (Addendum)
We discussed age appropriate health related issues, including available/recomended screening tests and vaccinations. We discussed a need for adhering to healthy diet and exercise. Labs were ordered to be later reviewed . All questions were answered. flu shot Shingrix discussed

## 2016-10-26 NOTE — Assessment & Plan Note (Signed)
Labs

## 2016-10-26 NOTE — Assessment & Plan Note (Signed)
Amlodipine.

## 2016-10-26 NOTE — Assessment & Plan Note (Signed)
On Vit D 

## 2017-10-24 ENCOUNTER — Other Ambulatory Visit: Payer: Self-pay | Admitting: Internal Medicine

## 2017-10-28 ENCOUNTER — Encounter: Payer: Managed Care, Other (non HMO) | Admitting: Internal Medicine

## 2017-11-04 ENCOUNTER — Encounter: Payer: Managed Care, Other (non HMO) | Admitting: Internal Medicine

## 2017-11-14 ENCOUNTER — Other Ambulatory Visit: Payer: Self-pay | Admitting: Internal Medicine

## 2018-01-02 ENCOUNTER — Other Ambulatory Visit (INDEPENDENT_AMBULATORY_CARE_PROVIDER_SITE_OTHER): Payer: 59

## 2018-01-02 ENCOUNTER — Encounter: Payer: Self-pay | Admitting: Internal Medicine

## 2018-01-02 ENCOUNTER — Ambulatory Visit (INDEPENDENT_AMBULATORY_CARE_PROVIDER_SITE_OTHER): Payer: 59 | Admitting: Internal Medicine

## 2018-01-02 VITALS — BP 130/82 | HR 68 | Temp 97.6°F | Ht 72.0 in | Wt 224.0 lb

## 2018-01-02 DIAGNOSIS — R05 Cough: Secondary | ICD-10-CM | POA: Diagnosis not present

## 2018-01-02 DIAGNOSIS — J4521 Mild intermittent asthma with (acute) exacerbation: Secondary | ICD-10-CM

## 2018-01-02 DIAGNOSIS — E559 Vitamin D deficiency, unspecified: Secondary | ICD-10-CM

## 2018-01-02 DIAGNOSIS — R059 Cough, unspecified: Secondary | ICD-10-CM

## 2018-01-02 DIAGNOSIS — Z0000 Encounter for general adult medical examination without abnormal findings: Secondary | ICD-10-CM | POA: Diagnosis not present

## 2018-01-02 DIAGNOSIS — E538 Deficiency of other specified B group vitamins: Secondary | ICD-10-CM

## 2018-01-02 LAB — CBC WITH DIFFERENTIAL/PLATELET
Basophils Absolute: 0.1 10*3/uL (ref 0.0–0.1)
Basophils Relative: 1.2 % (ref 0.0–3.0)
EOS PCT: 1.8 % (ref 0.0–5.0)
Eosinophils Absolute: 0.1 10*3/uL (ref 0.0–0.7)
HCT: 46.7 % (ref 39.0–52.0)
Hemoglobin: 15.8 g/dL (ref 13.0–17.0)
Lymphocytes Relative: 41 % (ref 12.0–46.0)
Lymphs Abs: 2.5 10*3/uL (ref 0.7–4.0)
MCHC: 33.8 g/dL (ref 30.0–36.0)
MCV: 92.5 fl (ref 78.0–100.0)
MONO ABS: 0.6 10*3/uL (ref 0.1–1.0)
Monocytes Relative: 10.4 % (ref 3.0–12.0)
NEUTROS PCT: 45.6 % (ref 43.0–77.0)
Neutro Abs: 2.8 10*3/uL (ref 1.4–7.7)
Platelets: 261 10*3/uL (ref 150.0–400.0)
RBC: 5.04 Mil/uL (ref 4.22–5.81)
RDW: 14.1 % (ref 11.5–15.5)
WBC: 6.1 10*3/uL (ref 4.0–10.5)

## 2018-01-02 LAB — BASIC METABOLIC PANEL
BUN: 10 mg/dL (ref 6–23)
CO2: 29 mEq/L (ref 19–32)
Calcium: 9.5 mg/dL (ref 8.4–10.5)
Chloride: 103 mEq/L (ref 96–112)
Creatinine, Ser: 0.97 mg/dL (ref 0.40–1.50)
GFR: 84.49 mL/min (ref >60.00)
Glucose, Bld: 105 mg/dL — ABNORMAL HIGH (ref 70–99)
Potassium: 4.3 mEq/L (ref 3.5–5.1)
Sodium: 141 mEq/L (ref 135–145)

## 2018-01-02 LAB — TSH: TSH: 2.18 u[IU]/mL (ref 0.35–4.50)

## 2018-01-02 LAB — URINALYSIS, ROUTINE W REFLEX MICROSCOPIC
Bilirubin Urine: NEGATIVE
Hgb urine dipstick: NEGATIVE
KETONES UR: NEGATIVE
Nitrite: NEGATIVE
PH: 8 (ref 5.0–8.0)
RBC / HPF: NONE SEEN (ref 0–?)
SPECIFIC GRAVITY, URINE: 1.01 (ref 1.000–1.030)
Total Protein, Urine: NEGATIVE
Urine Glucose: NEGATIVE
Urobilinogen, UA: 0.2 (ref 0.0–1.0)

## 2018-01-02 LAB — HEPATIC FUNCTION PANEL
ALBUMIN: 4.5 g/dL (ref 3.5–5.2)
ALT: 12 U/L (ref 0–53)
AST: 17 U/L (ref 0–37)
Alkaline Phosphatase: 62 U/L (ref 39–117)
Bilirubin, Direct: 0.2 mg/dL (ref 0.0–0.3)
Total Bilirubin: 0.7 mg/dL (ref 0.2–1.2)
Total Protein: 7.2 g/dL (ref 6.0–8.3)

## 2018-01-02 LAB — LIPID PANEL
Cholesterol: 168 mg/dL (ref 0–200)
HDL: 50 mg/dL (ref >39.00)
LDL Cholesterol: 100 mg/dL — ABNORMAL HIGH (ref 0–99)
NONHDL: 118.46
Total CHOL/HDL Ratio: 3
Triglycerides: 90 mg/dL (ref 0.0–149.0)
VLDL: 18 mg/dL (ref 0.0–40.0)

## 2018-01-02 LAB — VITAMIN B12: Vitamin B-12: 1117 pg/mL — ABNORMAL HIGH (ref 211–911)

## 2018-01-02 LAB — PSA: PSA: 5.74 ng/mL — AB (ref 0.10–4.00)

## 2018-01-02 LAB — VITAMIN D 25 HYDROXY (VIT D DEFICIENCY, FRACTURES): VITD: 22.8 ng/mL — ABNORMAL LOW (ref 30.00–100.00)

## 2018-01-02 MED ORDER — FLUTICASONE FUROATE-VILANTEROL 100-25 MCG/INH IN AEPB: 1.0000 | INHALATION_SPRAY | Freq: Every day | RESPIRATORY_TRACT | 5 refills | Status: DC

## 2018-01-02 MED ORDER — PROMETHAZINE-CODEINE 6.25-10 MG/5ML PO SYRP: 5.0000 mL | ORAL_SOLUTION | ORAL | 0 refills | Status: DC | PRN

## 2018-01-02 NOTE — Assessment & Plan Note (Signed)
D dimer Breo qd

## 2018-01-02 NOTE — Patient Instructions (Signed)

## 2018-01-02 NOTE — Assessment & Plan Note (Signed)
On B12 

## 2018-01-02 NOTE — Assessment & Plan Note (Signed)
x 2 months Breo D-dimer

## 2018-01-02 NOTE — Assessment & Plan Note (Signed)
We discussed age appropriate health related issues, including available/recomended screening tests and vaccinations. We discussed a need for adhering to healthy diet and exercise. Labs were ordered to be later reviewed . All questions were answered.  Andrew Rowland had a nl colon 2014 Dr Lyndel Safe Shingrix discussed CT ca scoring discussed 12/19

## 2018-01-02 NOTE — Progress Notes (Signed)
Subjective:  Patient ID: Andrew Rowland, male    DOB: 19-Dec-1960  Age: 57 y.o. MRN: [id number]  CC: No chief complaint on file.   HPI Andrew Rowland presents for a well exam C/o cough x 2 months after a trip to [place] GERD is better on Omeprazole  Outpatient Medications Prior to Visit  Medication Sig Dispense Refill  . amLODipine (NORVASC) 5 MG tablet TAKE 1 TABLET BY MOUTH EVERY DAY 90 tablet 0  . aspirin 81 MG tablet Take 81 mg by mouth daily.      . [facility] VITAMIN B12 1000 MCG tablet TAKE 1 TABLET BY MOUTH EVERY DAY 90 tablet 4  . Cholecalciferol (VITAMIN D3) 2000 UNITS capsule Take 1 capsule (2,000 Units total) by mouth daily. 100 capsule 3  . Cholecalciferol (VITAMIN D3) 50000 units CAPS Take 1 capsule by mouth once a week. 6 capsule 0   No facility-administered medications prior to visit.     ROS: Review of Systems  Constitutional: Negative for appetite change, fatigue and unexpected weight change.  HENT: Negative for congestion, nosebleeds, sneezing, sore throat and trouble swallowing.   Eyes: Negative for itching and visual disturbance.  Respiratory: Positive for cough. Negative for shortness of breath.   Cardiovascular: Negative for chest pain, palpitations and leg swelling.  Gastrointestinal: Negative for abdominal distention, blood in stool, diarrhea and nausea.  Genitourinary: Negative for frequency and hematuria.  Musculoskeletal: Negative for back pain, gait problem, joint swelling and neck pain.  Skin: Negative for rash.  Neurological: Negative for dizziness, tremors, speech difficulty and weakness.  Psychiatric/Behavioral: Negative for agitation, dysphoric mood, sleep disturbance and suicidal ideas. The patient is not nervous/anxious.     Objective:  BP 130/82 (BP Location: Left Arm, Patient Position: Sitting, Cuff Size: Large)   Pulse 68   Temp 97.6 F (36.4 C) (Oral)   Ht 6' (1.829 m)   Wt 224 lb (101.6 kg)   SpO2 98%   BMI 30.38 kg/m   BP  Readings from Last 3 Encounters:  01/02/18 130/82  10/26/16 122/84  10/21/15 110/70    Wt Readings from Last 3 Encounters:  01/02/18 224 lb (101.6 kg)  10/26/16 225 lb (102.1 kg)  10/21/15 224 lb (101.6 kg)    Physical Exam Constitutional:      General: He is not in acute distress.    Appearance: He is well-developed.     Comments: NAD  Eyes:     Conjunctiva/sclera: Conjunctivae normal.     Pupils: Pupils are equal, round, and reactive to light.  Neck:     Musculoskeletal: Normal range of motion.     Thyroid: No thyromegaly.     Vascular: No JVD.  Cardiovascular:     Rate and Rhythm: Normal rate and regular rhythm.     Heart sounds: Normal heart sounds. No murmur. No friction rub. No gallop.   Pulmonary:     Effort: Pulmonary effort is normal. No respiratory distress.     Breath sounds: Normal breath sounds. No wheezing or rales.  Chest:     Chest wall: No tenderness.  Abdominal:     General: Bowel sounds are normal. There is no distension.     Palpations: Abdomen is soft. There is no mass.     Tenderness: There is no abdominal tenderness. There is no guarding or rebound.  Musculoskeletal: Normal range of motion.        General: No tenderness.  Lymphadenopathy:     Cervical: No cervical adenopathy.  Skin:    General: Skin is warm and dry.     Findings: No rash.  Neurological:     Mental Status: He is alert and oriented to person, place, and time.     Cranial Nerves: No cranial nerve deficit.     Motor: No abnormal muscle tone.     Coordination: Coordination normal.     Gait: Gait normal.     Deep Tendon Reflexes: Reflexes are normal and symmetric.  Psychiatric:        Behavior: Behavior normal.        Thought Content: Thought content normal.        Judgment: Judgment normal.     Lab Results  Component Value Date   WBC 5.2 10/26/2016   HGB 15.9 10/26/2016   HCT 47.1 10/26/2016   PLT 243.0 10/26/2016   GLUCOSE 96 10/26/2016   CHOL 193 10/26/2016   TRIG  85.0 10/26/2016   HDL 44.10 10/26/2016   LDLCALC 132 (H) 10/26/2016   ALT 13 10/26/2016   AST 16 10/26/2016   NA 140 10/26/2016   K 4.5 10/26/2016   CL 104 10/26/2016   CREATININE 1.04 10/26/2016   BUN 9 10/26/2016   CO2 29 10/26/2016   TSH 3.04 10/26/2016   PSA 1.35 10/26/2016    Dg Chest 2 View  Result Date: 01/22/2013 CLINICAL DATA:  Cough.  Shortness of breath and chest pain EXAM: CHEST  2 VIEW COMPARISON:  None. FINDINGS: The heart size and mediastinal contours are within normal limits. Diminished lung volumes. Both lungs are clear. The visualized skeletal structures are unremarkable. IMPRESSION: 1. Low lung volumes.  No evidence for pneumonia. Electronically Signed   By: Kerby Moors M.D.   On: 01/22/2013 10:15    Assessment & Plan:   There are no diagnoses linked to this encounter.   No orders of the defined types were placed in this encounter.    Follow-up: No follow-ups on file.  Walker Kehr, MD

## 2018-01-03 LAB — D-DIMER, QUANTITATIVE (NOT AT ARMC): D DIMER QUANT: 0.29 ug{FEU}/mL (ref <0.50)

## 2018-01-06 ENCOUNTER — Other Ambulatory Visit: Payer: Self-pay | Admitting: Internal Medicine

## 2018-01-06 ENCOUNTER — Encounter: Payer: Self-pay | Admitting: Internal Medicine

## 2018-01-06 DIAGNOSIS — E559 Vitamin D deficiency, unspecified: Secondary | ICD-10-CM

## 2018-01-06 DIAGNOSIS — R972 Elevated prostate specific antigen [PSA]: Secondary | ICD-10-CM | POA: Insufficient documentation

## 2018-01-06 MED ORDER — CIPROFLOXACIN HCL 500 MG PO TABS: 500.0000 mg | ORAL_TABLET | Freq: Two times a day (BID) | ORAL | 0 refills | Status: DC

## 2018-01-06 MED ORDER — VITAMIN D3 50 MCG (2000 UT) PO CAPS: 2000.0000 [IU] | ORAL_CAPSULE | Freq: Every day | ORAL | 3 refills | Status: DC

## 2018-01-06 MED ORDER — CYANOCOBALAMIN 1000 MCG PO TABS: 500.0000 ug | ORAL_TABLET | Freq: Every day | ORAL | 4 refills | Status: DC

## 2018-01-06 MED ORDER — VITAMIN D3 1.25 MG (50000 UT) PO CAPS: 1.0000 | ORAL_CAPSULE | ORAL | 0 refills | Status: DC

## 2018-02-05 ENCOUNTER — Ambulatory Visit (INDEPENDENT_AMBULATORY_CARE_PROVIDER_SITE_OTHER)
Admission: RE | Admit: 2018-02-05 | Discharge: 2018-02-05 | Disposition: A | Discharge status: Home / Self Care | Payer: 59 | Source: Ambulatory Visit | Attending: Internal Medicine | Admitting: Internal Medicine

## 2018-02-05 ENCOUNTER — Ambulatory Visit (INDEPENDENT_AMBULATORY_CARE_PROVIDER_SITE_OTHER): Payer: 59 | Admitting: Internal Medicine

## 2018-02-05 ENCOUNTER — Encounter: Payer: Self-pay | Admitting: Internal Medicine

## 2018-02-05 DIAGNOSIS — R059 Cough, unspecified: Secondary | ICD-10-CM

## 2018-02-05 DIAGNOSIS — J4521 Mild intermittent asthma with (acute) exacerbation: Secondary | ICD-10-CM

## 2018-02-05 DIAGNOSIS — E538 Deficiency of other specified B group vitamins: Secondary | ICD-10-CM

## 2018-02-05 DIAGNOSIS — R05 Cough: Secondary | ICD-10-CM

## 2018-02-05 DIAGNOSIS — E559 Vitamin D deficiency, unspecified: Secondary | ICD-10-CM | POA: Diagnosis not present

## 2018-02-05 MED ORDER — CEFUROXIME AXETIL 500 MG PO TABS: ORAL_TABLET | ORAL | 1 refills | Status: DC

## 2018-02-05 MED ORDER — METHYLPREDNISOLONE ACETATE 80 MG/ML IJ SUSP
80.0000 mg | Freq: Once | INTRAMUSCULAR | Status: AC
  Administered 2018-02-05: 80 mg via INTRAMUSCULAR

## 2018-02-05 MED ORDER — HYDROCODONE-HOMATROPINE 5-1.5 MG/5ML PO SYRP: 5.0000 mL | ORAL_SOLUTION | Freq: Three times a day (TID) | ORAL | 0 refills | Status: DC | PRN

## 2018-02-05 NOTE — Progress Notes (Signed)
Subjective:  Patient ID: Andrew Rowland, male    DOB: 1960-05-03  Age: 58 y.o. MRN: [id number]  CC: No chief complaint on file.   HPI Andrew Rowland presents for severe spastic cough - it started 1 day after he started Cipro - on Cipro now. Breo helped...   Outpatient Medications Prior to Visit  Medication Sig Dispense Refill  . amLODipine (NORVASC) 5 MG tablet TAKE 1 TABLET BY MOUTH EVERY DAY 90 tablet 0  . aspirin 81 MG tablet Take 81 mg by mouth daily.      . Cholecalciferol (VITAMIN D3) 1.25 MG (50000 UT) CAPS Take 1 capsule by mouth once a week. 8 capsule 0  . cyanocobalamin ([facility] VITAMIN B12) 1000 MCG tablet Take 0.5 tablets (500 mcg total) by mouth daily. 90 tablet 4  . fluticasone furoate-vilanterol (BREO ELLIPTA) 100-25 MCG/INH AEPB Inhale 1 puff into the lungs daily. 1 each 5  . Cholecalciferol (VITAMIN D3) 50 MCG (2000 UT) capsule Take 1 capsule (2,000 Units total) by mouth daily. 100 capsule 3  . ciprofloxacin (CIPRO) 500 MG tablet Take 1 tablet (500 mg total) by mouth 2 (two) times daily. 28 tablet 0  . promethazine-codeine (PHENERGAN WITH CODEINE) 6.25-10 MG/5ML syrup Take 5 mLs by mouth every 4 (four) hours as needed. 300 mL 0   No facility-administered medications prior to visit.     ROS: Review of Systems  Constitutional: Negative for appetite change, fatigue and unexpected weight change.  HENT: Negative for congestion, nosebleeds, sneezing, sore throat and trouble swallowing.   Eyes: Negative for itching and visual disturbance.  Respiratory: Positive for cough, choking and shortness of breath.   Cardiovascular: Negative for chest pain, palpitations and leg swelling.  Gastrointestinal: Negative for abdominal distention, blood in stool, diarrhea and nausea.  Genitourinary: Negative for frequency and hematuria.  Musculoskeletal: Negative for back pain, gait problem, joint swelling and neck pain.  Skin: Negative for rash.  Neurological: Negative for dizziness,  tremors, speech difficulty and weakness.  Psychiatric/Behavioral: Negative for agitation, dysphoric mood and sleep disturbance. The patient is not nervous/anxious.     Objective:  BP 136/82 (BP Location: Left Arm, Patient Position: Sitting, Cuff Size: Large)   Pulse 77   Temp 97.9 F (36.6 C) (Oral)   Ht 6' (1.829 m)   Wt 219 lb (99.3 kg)   SpO2 99%   BMI 29.70 kg/m   BP Readings from Last 3 Encounters:  02/05/18 136/82  01/02/18 130/82  10/26/16 122/84    Wt Readings from Last 3 Encounters:  02/05/18 219 lb (99.3 kg)  01/02/18 224 lb (101.6 kg)  10/26/16 225 lb (102.1 kg)    Physical Exam Constitutional:      General: He is not in acute distress.    Appearance: He is well-developed.     Comments: NAD  Eyes:     Conjunctiva/sclera: Conjunctivae normal.     Pupils: Pupils are equal, round, and reactive to light.  Neck:     Musculoskeletal: Normal range of motion.     Thyroid: No thyromegaly.     Vascular: No JVD.  Cardiovascular:     Rate and Rhythm: Normal rate and regular rhythm.     Heart sounds: Normal heart sounds. No murmur. No friction rub. No gallop.   Pulmonary:     Effort: Pulmonary effort is normal. No respiratory distress.     Breath sounds: Normal breath sounds. No wheezing or rales.  Chest:     Chest wall: No tenderness.  Abdominal:     General: Bowel sounds are normal. There is no distension.     Palpations: Abdomen is soft. There is no mass.     Tenderness: There is no abdominal tenderness. There is no guarding or rebound.  Musculoskeletal: Normal range of motion.        General: No tenderness.  Lymphadenopathy:     Cervical: No cervical adenopathy.  Skin:    General: Skin is warm and dry.     Findings: No rash.  Neurological:     Mental Status: He is alert and oriented to person, place, and time.     Cranial Nerves: No cranial nerve deficit.     Motor: No abnormal muscle tone.     Coordination: Coordination normal.     Gait: Gait normal.       Deep Tendon Reflexes: Reflexes are normal and symmetric.  Psychiatric:        Behavior: Behavior normal.        Thought Content: Thought content normal.        Judgment: Judgment normal.   Severe coughing spells during the exam  Lab Results  Component Value Date   WBC 6.1 01/02/2018   HGB 15.8 01/02/2018   HCT 46.7 01/02/2018   PLT 261.0 01/02/2018   GLUCOSE 105 (H) 01/02/2018   CHOL 168 01/02/2018   TRIG 90.0 01/02/2018   HDL 50.00 01/02/2018   LDLCALC 100 (H) 01/02/2018   ALT 12 01/02/2018   AST 17 01/02/2018   NA 141 01/02/2018   K 4.3 01/02/2018   CL 103 01/02/2018   CREATININE 0.97 01/02/2018   BUN 10 01/02/2018   CO2 29 01/02/2018   TSH 2.18 01/02/2018   PSA 5.74 (H) 01/02/2018    Dg Chest 2 View  Result Date: 01/22/2013 CLINICAL DATA:  Cough.  Shortness of breath and chest pain EXAM: CHEST  2 VIEW COMPARISON:  None. FINDINGS: The heart size and mediastinal contours are within normal limits. Diminished lung volumes. Both lungs are clear. The visualized skeletal structures are unremarkable. IMPRESSION: 1. Low lung volumes.  No evidence for pneumonia. Electronically Signed   By: Kerby Moors M.D.   On: 01/22/2013 10:15    Assessment & Plan:   There are no diagnoses linked to this encounter.   No orders of the defined types were placed in this encounter.    Follow-up: No follow-ups on file.  Walker Kehr, MD

## 2018-02-05 NOTE — Assessment & Plan Note (Addendum)
Depo-medrol 80 mg IM Cefuroxime x 10 d, probiotic. Stop Cipro CXR Hycodan syr prn Advair qd

## 2018-02-05 NOTE — Patient Instructions (Signed)
Take Cefuroxime x 10 days, take a probiotic [employer], dispensing). Stop Cipro   You can use over-the-counter  "cold" medicines  such as "Afrin" nasal spray for nasal congestion as directed. Use " Delsym" or" Robitussin" cough syrup varietis for cough.  You can use plain "Tylenol" or "Advil" for fever, chills and achyness. Use Halls or Ricola cough drops.   Please, make an appointment if you are not better or if you're worse.

## 2018-02-08 ENCOUNTER — Other Ambulatory Visit: Payer: Self-pay | Admitting: Internal Medicine

## 2018-02-09 ENCOUNTER — Encounter: Payer: Self-pay | Admitting: Internal Medicine

## 2018-02-09 NOTE — Assessment & Plan Note (Signed)
Hycodan cough syrup

## 2018-02-09 NOTE — Assessment & Plan Note (Signed)
On vitamin D 

## 2018-02-09 NOTE — Assessment & Plan Note (Signed)
On B12 

## 2018-02-13 ENCOUNTER — Telehealth: Payer: Self-pay | Admitting: Pulmonary Disease

## 2018-02-13 NOTE — Telephone Encounter (Signed)
Thanks! Will route to Drs. Alva and Plotnikov so they are aware.

## 2018-02-13 NOTE — Telephone Encounter (Signed)
Per RA, he received a message from patient's PCP Dr. Alain Marion asking if he could see the patient as a consult for cough. RA would like to double book patient at 12pm on 02/14/2018. I left a message for patient to call back to see if he would be willing to come in and see RA.   Will leave this encounter open until I hear back from patient.

## 2018-02-13 NOTE — Telephone Encounter (Signed)
Patient returned call and states he can come in 02/14/2018 at 12:00.  I scheduled consult for this time (15 min as do not have access to override for 30 min).  Patient is aware to be here at 11:45 am for check in.  No call back is required.

## 2018-02-14 ENCOUNTER — Encounter: Payer: Self-pay | Admitting: Pulmonary Disease

## 2018-02-14 ENCOUNTER — Ambulatory Visit (INDEPENDENT_AMBULATORY_CARE_PROVIDER_SITE_OTHER): Payer: 59 | Admitting: Pulmonary Disease

## 2018-02-14 DIAGNOSIS — J4521 Mild intermittent asthma with (acute) exacerbation: Secondary | ICD-10-CM | POA: Diagnosis not present

## 2018-02-14 NOTE — Patient Instructions (Signed)
You likely had a post viral bronchitic cough which is now resolving.  Take Hycodan cough syrup for another 3 nights and then okay to stop. Take Delsym cough syrup in the daytime as needed for cough.  Continue Breo for 7 more days and then stop, rinse mouth after use.  Call me back if cough returns

## 2018-02-14 NOTE — Progress Notes (Signed)
Subjective:    Patient ID: Andrew Rowland, male    DOB: 16-Jul-1960, 58 y.o.   MRN: [id number]  HPI   Chief Complaint  Patient presents with  . Consult    Patient has productive cough -white, chest tightness. Pt's cough has approved over last week.     58 year old remote smoker presents for evaluation of recurrent cough He smoked less than 10 pack years and quit more than 20 years ago. He traveled to [place] for a niece's wedding in 10/2017 when he developed URI symptoms followed by a dry cough he had some leftover ciprofloxacin that he took but cough persisted for almost 6 to 8 weeks until his routine PCP appointment in 12/2017.  He was given Breo on this visit and cough resolved soon after. He then traveled to [place] around Christmas vacation and cough had completely resolved. He had another visit to [place] 01/31/2018 to visit family and there he developed a mild fever followed by dry cough other main family members including his wife and brother subsequently also got sick.  He again developed severe cough with mild wheezing and another PCP visit on 1/22 he was felt to have asthmatic bronchitis.  He was given cefuroxime and a shot of Depo-Medrol. Chest x-ray was obtained which I personally reviewed, does not show any infiltrates or effusions.  He was given Hycodan syrup which she has taken initially for 3 days with good improvement in his cough, he is now only taking this nightly.  He now reports 90% improvement in his cough and has a residual dry cough.  No systemic complaints of fevers, myalgias. He denies postnasal drip, he did have nasal congestion around the time of his symptoms which really opened up with using Afrin for a couple of nights.  He reports occasional heartburn and use medication in the remote past but has not needed medication lately   Past Medical History:  Diagnosis Date  . Hypertension   . Insomnia    History reviewed. No pertinent surgical  history.  Allergies  Allergen Reactions  . Lisinopril     REACTION: cough    Social History   Socioeconomic History  . Marital status: Married    Spouse name: Not on file  . Number of children: Not on file  . Years of education: Not on file  . Highest education level: Not on file  Occupational History  . Not on file  Social Needs  . Financial resource strain: Not on file  . Food insecurity:    Worry: Not on file    Inability: Not on file  . Transportation needs:    Medical: Not on file    Non-medical: Not on file  Tobacco Use  . Smoking status: Former Research scientist (life sciences)  . Smokeless tobacco: Former Network engineer and Sexual Activity  . Alcohol use: Yes    Comment: OCC  . Drug use: No  . Sexual activity: Yes  Lifestyle  . Physical activity:    Days per week: Not on file    Minutes per session: Not on file  . Stress: Not on file  Relationships  . Social connections:    Talks on phone: Not on file    Gets together: Not on file    Attends religious service: Not on file    Active member of club or organization: Not on file    Attends meetings of clubs or organizations: Not on file    Relationship status: Not on file  .  Intimate partner violence:    Fear of current or ex partner: Not on file    Emotionally abused: Not on file    Physically abused: Not on file    Forced sexual activity: Not on file  Other Topics Concern  . Not on file  Social History Narrative   Regular Exercise- No tennis      Family History  Problem Relation Age of Onset  . Hypertension Mother   . Hypertension Father   . Hypertension Other       Review of Systems  Constitutional: Negative for fever and unexpected weight change.  HENT: Negative for congestion, dental problem, ear pain, nosebleeds, postnasal drip, rhinorrhea, sinus pressure, sneezing, sore throat and trouble swallowing.   Eyes: Negative for redness and itching.  Respiratory: Positive for cough. Negative for chest tightness,  shortness of breath and wheezing.   Cardiovascular: Negative for palpitations and leg swelling.  Gastrointestinal: Negative for nausea and vomiting.  Genitourinary: Negative for dysuria.  Musculoskeletal: Negative for joint swelling.  Skin: Negative for rash.  Allergic/Immunologic: Negative.  Negative for environmental allergies, food allergies and immunocompromised state.  Neurological: Negative for headaches.  Hematological: Does not bruise/bleed easily.  Psychiatric/Behavioral: Negative for dysphoric mood. The patient is not nervous/anxious.        Objective:   Physical Exam  Gen. Pleasant,  in no distress, normal affect ENT - no pallor,icterus, no post nasal drip, class 2 airway Neck: No JVD, no thyromegaly, no carotid bruits Lungs: no use of accessory muscles, no dullness to percussion, decreased without rales or rhonchi  Cardiovascular: Rhythm regular, heart sounds  normal, no murmurs or gallops, no peripheral edema Abdomen: soft and non-tender, no hepatosplenomegaly, BS normal. Musculoskeletal: No deformities, no cyanosis or clubbing Neuro:  alert, non focal, no tremors       Assessment & Plan:

## 2018-02-14 NOTE — Assessment & Plan Note (Signed)
His second episode of cough in January almost certainly seems to be a viral bronchitis since there was a mild fever preceding and other family members also got sick sequentially.  He is 90% improved but has a residual dry cough.  His earlier episode lasting from October to December also seems to have been a post bronchitic cough with longer time to resolution.  I doubt that he has underlying pulmonary disease and therefore does not need Breo long-term.  I have asked him to stop this after current symptoms resolved. He can use Hycodan cough syrup for another week and then can go to our over-the-counter Delsym as needed  Clear chest x-ray is reassuring.  We will obtain spirometry only if symptoms persist.  It is possible that GERD could have been perpetuating his cough the last time around but he does not seem to have any active reflux hence we will hold off on treatment

## 2018-02-25 ENCOUNTER — Other Ambulatory Visit: Payer: Self-pay | Admitting: Internal Medicine

## 2019-01-05 ENCOUNTER — Other Ambulatory Visit: Payer: Self-pay

## 2019-01-05 ENCOUNTER — Encounter: Payer: Self-pay | Admitting: Internal Medicine

## 2019-01-05 ENCOUNTER — Ambulatory Visit (INDEPENDENT_AMBULATORY_CARE_PROVIDER_SITE_OTHER): Payer: Managed Care, Other (non HMO) | Admitting: Internal Medicine

## 2019-01-05 ENCOUNTER — Other Ambulatory Visit: Payer: Self-pay | Admitting: Internal Medicine

## 2019-01-05 VITALS — BP 128/84 | HR 83 | Temp 98.7°F | Ht 72.0 in | Wt 226.0 lb

## 2019-01-05 DIAGNOSIS — R972 Elevated prostate specific antigen [PSA]: Secondary | ICD-10-CM | POA: Diagnosis not present

## 2019-01-05 DIAGNOSIS — I10 Essential (primary) hypertension: Secondary | ICD-10-CM | POA: Diagnosis not present

## 2019-01-05 DIAGNOSIS — E559 Vitamin D deficiency, unspecified: Secondary | ICD-10-CM

## 2019-01-05 DIAGNOSIS — Z0000 Encounter for general adult medical examination without abnormal findings: Secondary | ICD-10-CM

## 2019-01-05 DIAGNOSIS — E538 Deficiency of other specified B group vitamins: Secondary | ICD-10-CM

## 2019-01-05 DIAGNOSIS — E785 Hyperlipidemia, unspecified: Secondary | ICD-10-CM

## 2019-01-05 LAB — LIPID PANEL
Cholesterol: 197 mg/dL (ref 0–200)
HDL: 45.1 mg/dL (ref >39.00)
LDL Cholesterol: 133 mg/dL — ABNORMAL HIGH (ref 0–99)
NonHDL: 151.46
Total CHOL/HDL Ratio: 4
Triglycerides: 94 mg/dL (ref 0.0–149.0)
VLDL: 18.8 mg/dL (ref 0.0–40.0)

## 2019-01-05 LAB — CBC WITH DIFFERENTIAL/PLATELET
Basophils Absolute: 0.1 10*3/uL (ref 0.0–0.1)
Basophils Relative: 1.6 % (ref 0.0–3.0)
Eosinophils Absolute: 0.2 10*3/uL (ref 0.0–0.7)
Eosinophils Relative: 3.4 % (ref 0.0–5.0)
HCT: 45.3 % (ref 39.0–52.0)
Hemoglobin: 15.3 g/dL (ref 13.0–17.0)
Lymphocytes Relative: 34.1 % (ref 12.0–46.0)
Lymphs Abs: 1.6 10*3/uL (ref 0.7–4.0)
MCHC: 33.7 g/dL (ref 30.0–36.0)
MCV: 92.2 fl (ref 78.0–100.0)
Monocytes Absolute: 0.6 10*3/uL (ref 0.1–1.0)
Monocytes Relative: 12.1 % — ABNORMAL HIGH (ref 3.0–12.0)
Neutro Abs: 2.3 10*3/uL (ref 1.4–7.7)
Neutrophils Relative %: 48.8 % (ref 43.0–77.0)
Platelets: 239 10*3/uL (ref 150.0–400.0)
RBC: 4.92 Mil/uL (ref 4.22–5.81)
RDW: 13.6 % (ref 11.5–15.5)
WBC: 4.8 10*3/uL (ref 4.0–10.5)

## 2019-01-05 LAB — URINALYSIS
Bilirubin Urine: NEGATIVE
Ketones, ur: NEGATIVE
Leukocytes,Ua: NEGATIVE
Nitrite: NEGATIVE
Specific Gravity, Urine: 1.02 (ref 1.000–1.030)
Total Protein, Urine: NEGATIVE
Urine Glucose: NEGATIVE
Urobilinogen, UA: 0.2 (ref 0.0–1.0)
pH: 6.5 (ref 5.0–8.0)

## 2019-01-05 LAB — VITAMIN B12: Vitamin B-12: 860 pg/mL (ref 211–911)

## 2019-01-05 LAB — HEPATIC FUNCTION PANEL
ALT: 14 U/L (ref 0–53)
AST: 15 U/L (ref 0–37)
Albumin: 4.3 g/dL (ref 3.5–5.2)
Alkaline Phosphatase: 52 U/L (ref 39–117)
Bilirubin, Direct: 0.1 mg/dL (ref 0.0–0.3)
Total Bilirubin: 0.7 mg/dL (ref 0.2–1.2)
Total Protein: 6.8 g/dL (ref 6.0–8.3)

## 2019-01-05 LAB — BASIC METABOLIC PANEL
BUN: 16 mg/dL (ref 6–23)
CO2: 27 mEq/L (ref 19–32)
Calcium: 9.2 mg/dL (ref 8.4–10.5)
Chloride: 103 mEq/L (ref 96–112)
Creatinine, Ser: 0.99 mg/dL (ref 0.40–1.50)
GFR: 77.37 mL/min (ref >60.00)
Glucose, Bld: 96 mg/dL (ref 70–99)
Potassium: 3.9 mEq/L (ref 3.5–5.1)
Sodium: 139 mEq/L (ref 135–145)

## 2019-01-05 LAB — PSA: PSA: 2.79 ng/mL (ref 0.10–4.00)

## 2019-01-05 LAB — VITAMIN D 25 HYDROXY (VIT D DEFICIENCY, FRACTURES): VITD: 24.24 ng/mL — ABNORMAL LOW (ref 30.00–100.00)

## 2019-01-05 LAB — TSH: TSH: 2.57 u[IU]/mL (ref 0.35–4.50)

## 2019-01-05 MED ORDER — DEXILANT 60 MG PO CPDR: 60.0000 mg | DELAYED_RELEASE_CAPSULE | Freq: Every day | ORAL | 3 refills | Status: DC

## 2019-01-05 NOTE — Assessment & Plan Note (Signed)
PSA

## 2019-01-05 NOTE — Assessment & Plan Note (Signed)
Amlodipine.

## 2019-01-05 NOTE — Assessment & Plan Note (Signed)
On B12 

## 2019-01-05 NOTE — Progress Notes (Signed)
Subjective:  Patient ID: Andrew Rowland, male    DOB: 10/27/1960  Age: 58 y.o. MRN: [id number]  CC: No chief complaint on file.   HPI Andrew Rowland presents fora well exam C/o  GERD - worse with wt gain F/u vit D def and vit B12 def  Outpatient Medications Prior to Visit  Medication Sig Dispense Refill  . amLODipine (NORVASC) 5 MG tablet TAKE 1 TABLET BY MOUTH EVERY DAY 90 tablet 3  . aspirin 81 MG tablet Take 81 mg by mouth daily.      . Cholecalciferol (VITAMIN D-3) 25 MCG (1000 UT) CAPS Take by mouth.    . cyanocobalamin ([facility] VITAMIN B12) 1000 MCG tablet Take 0.5 tablets (500 mcg total) by mouth daily. 90 tablet 4  . fluticasone furoate-vilanterol (BREO ELLIPTA) 100-25 MCG/INH AEPB Inhale 1 puff into the lungs daily. 1 each 5  . cefUROXime (CEFTIN) 500 MG tablet 1 po bid 20 tablet 1  . Cholecalciferol (VITAMIN D3) 1.25 MG (50000 UT) CAPS Take 1 capsule by mouth once a week. 8 capsule 0  . HYDROcodone-homatropine (HYCODAN) 5-1.5 MG/5ML syrup Take 5 mLs by mouth every 8 (eight) hours as needed for cough. 240 mL 0   No facility-administered medications prior to visit.    ROS: Review of Systems  Constitutional: Positive for unexpected weight change. Negative for appetite change and fatigue.  HENT: Negative for congestion, nosebleeds, sneezing, sore throat and trouble swallowing.   Eyes: Negative for itching and visual disturbance.  Respiratory: Negative for cough.   Cardiovascular: Negative for chest pain, palpitations and leg swelling.  Gastrointestinal: Negative for abdominal distention, blood in stool, diarrhea and nausea.  Genitourinary: Negative for frequency and hematuria.  Musculoskeletal: Negative for back pain, gait problem, joint swelling and neck pain.  Skin: Negative for rash.  Neurological: Negative for dizziness, tremors, speech difficulty and weakness.  Psychiatric/Behavioral: Negative for agitation, dysphoric mood and sleep disturbance. The patient is not  nervous/anxious.     Objective:  BP 128/84 (BP Location: Left Arm, Patient Position: Sitting, Cuff Size: Large)   Pulse 83   Temp 98.7 F (37.1 C) (Oral)   Ht 6' (1.829 m)   Wt 226 lb (102.5 kg)   SpO2 95%   BMI 30.65 kg/m   BP Readings from Last 3 Encounters:  01/05/19 128/84  02/14/18 114/72  02/05/18 136/82    Wt Readings from Last 3 Encounters:  01/05/19 226 lb (102.5 kg)  02/14/18 217 lb (98.4 kg)  02/05/18 219 lb (99.3 kg)    Physical Exam Constitutional:      General: He is not in acute distress.    Appearance: He is well-developed.     Comments: NAD  Eyes:     Conjunctiva/sclera: Conjunctivae normal.     Pupils: Pupils are equal, round, and reactive to light.  Neck:     Thyroid: No thyromegaly.     Vascular: No JVD.  Cardiovascular:     Rate and Rhythm: Normal rate and regular rhythm.     Heart sounds: Normal heart sounds. No murmur. No friction rub. No gallop.   Pulmonary:     Effort: Pulmonary effort is normal. No respiratory distress.     Breath sounds: Normal breath sounds. No wheezing or rales.  Chest:     Chest wall: No tenderness.  Abdominal:     General: Bowel sounds are normal. There is no distension.     Palpations: Abdomen is soft. There is no mass.  Tenderness: There is no abdominal tenderness. There is no guarding or rebound.  Musculoskeletal:        General: No tenderness. Normal range of motion.     Cervical back: Normal range of motion.  Lymphadenopathy:     Cervical: No cervical adenopathy.  Skin:    General: Skin is warm and dry.     Findings: No rash.  Neurological:     Mental Status: He is alert and oriented to person, place, and time.     Cranial Nerves: No cranial nerve deficit.     Motor: No abnormal muscle tone.     Coordination: Coordination normal.     Gait: Gait normal.     Deep Tendon Reflexes: Reflexes are normal and symmetric.  Psychiatric:        Behavior: Behavior normal.        Thought Content: Thought  content normal.        Judgment: Judgment normal.     Lab Results  Component Value Date   WBC 6.1 01/02/2018   HGB 15.8 01/02/2018   HCT 46.7 01/02/2018   PLT 261.0 01/02/2018   GLUCOSE 105 (H) 01/02/2018   CHOL 168 01/02/2018   TRIG 90.0 01/02/2018   HDL 50.00 01/02/2018   LDLCALC 100 (H) 01/02/2018   ALT 12 01/02/2018   AST 17 01/02/2018   NA 141 01/02/2018   K 4.3 01/02/2018   CL 103 01/02/2018   CREATININE 0.97 01/02/2018   BUN 10 01/02/2018   CO2 29 01/02/2018   TSH 2.18 01/02/2018   PSA 5.74 (H) 01/02/2018    DG Chest 2 View  Result Date: 02/06/2018 CLINICAL DATA:  Cough for 10 days EXAM: CHEST - 2 VIEW COMPARISON:  01/22/2013 FINDINGS: Normal heart size. Lungs clear. No pneumothorax. No pleural effusion. IMPRESSION: No active cardiopulmonary disease. Electronically Signed   By: Marybelle Killings M.D.   On: 02/06/2018 08:14    Assessment & Plan:   There are no diagnoses linked to this encounter.   No orders of the defined types were placed in this encounter.    Follow-up: No follow-ups on file.  Walker Kehr, MD

## 2019-01-05 NOTE — Assessment & Plan Note (Signed)
Vit D 

## 2019-01-05 NOTE — Patient Instructions (Addendum)
These suggestions will probably help you to improve your metabolism if you are not overweight and to lose weight if you are overweight: 1.  Reduce your consumption of sugars and starches.  Eliminate high fructose corn syrup from your diet.  Reduce your consumption of processed foods.  For desserts try to have seasonal fruits, berries, nuts, cheeses or dark chocolate with more than 70% cacao. 2.  Do not snack 3.  You do not have to eat breakfast.  If you choose to have breakfast-eat plain greek yogurt, eggs, oatmeal (without sugar) 4.  Drink water, freshly brewed unsweetened tea (green, black or herbal) or coffee.  Do not drink sodas including diet sodas , juices, beverages sweetened with artificial sweeteners. 5.  Reduce your consumption of refined grains. 6.  Avoid protein drinks such as Optifast, Slim fast etc. Eat chicken, fish, meat, dairy and beans for your sources of protein 7.  Natural unprocessed fats like cold pressed virgin olive oil, butter, coconut oil are good for you.  Eat avocados 8.  Increase your consumption of fiber.  Fruits, berries, vegetables, whole grains, flaxseeds, Chia seeds, beans, popcorn, nuts, oatmeal are good sources of fiber 9.  Use vinegar in your diet, i.e. apple cider vinegar, red wine or balsamic vinegar 10.  You can try fasting.  For example you can skip breakfast and lunch every other day (24-hour fast) 11.  Stress reduction, good night sleep, relaxation, meditation, yoga and other physical activity is likely to help you to maintain low weight too. 12.  If you drink alcohol, limit your alcohol intake to no more than 2 drinks a day.    Cardiac CT calcium scoring test $150 Tel # is [phone]   Computed tomography, more commonly known as a CT or CAT scan, is a diagnostic medical imaging test. Like traditional x-rays, it produces multiple images or pictures of the inside of the body. The cross-sectional images generated during a CT scan can be reformatted in  multiple planes. They can even generate three-dimensional images. These images can be viewed on a computer monitor, printed on film or by a 3D printer, or transferred to a CD or DVD. CT images of internal organs, bones, soft tissue and blood vessels provide greater detail than traditional x-rays, particularly of soft tissues and blood vessels. A cardiac CT scan for coronary calcium is a non-invasive way of obtaining information about the presence, location and extent of calcified plaque in the coronary arteries--the vessels that supply oxygen-containing blood to the heart muscle. Calcified plaque results when there is a build-up of fat and other substances under the inner layer of the artery. This material can calcify which signals the presence of atherosclerosis, a disease of the vessel wall, also called coronary artery disease (CAD). People with this disease have an increased risk for heart attacks. In addition, over time, progression of plaque build up (CAD) can narrow the arteries or even close off blood flow to the heart. The result may be chest pain, sometimes called "angina," or a heart attack. Because calcium is a marker of CAD, the amount of calcium detected on a cardiac CT scan is a helpful prognostic tool. The findings on cardiac CT are expressed as a calcium score. Another name for this test is coronary artery calcium scoring.  What are some common uses of the procedure? The goal of cardiac CT scan for calcium scoring is to determine if CAD is present and to what extent, even if there are no symptoms. It is  a screening study that may be recommended by a physician for patients with risk factors for CAD but no clinical symptoms. The major risk factors for CAD are: . high blood cholesterol levels  . family history of heart attacks  . diabetes  . high blood pressure  . cigarette smoking  . overweight or obese  . physical inactivity   A negative cardiac CT scan for calcium scoring shows no  calcification within the coronary arteries. This suggests that CAD is absent or so minimal it cannot be seen by this technique. The chance of having a heart attack over the next two to five years is very low under these circumstances. A positive test means that CAD is present, regardless of whether or not the patient is experiencing any symptoms. The amount of calcification--expressed as the calcium score--may help to predict the likelihood of a myocardial infarction (heart attack) in the coming years and helps your medical doctor or cardiologist decide whether the patient may need to take preventive medicine or undertake other measures such as diet and exercise to lower the risk for heart attack. The extent of CAD is graded according to your calcium score:  Calcium Score  Presence of CAD (coronary artery disease)  0 No evidence of CAD   1-10 Minimal evidence of CAD  11-100 Mild evidence of CAD  101-400 Moderate evidence of CAD  Over 400 Extensive evidence of CAD

## 2019-01-10 ENCOUNTER — Other Ambulatory Visit: Payer: Self-pay | Admitting: Internal Medicine

## 2019-01-10 MED ORDER — VITAMIN D3 50 MCG (2000 UT) PO CAPS: 2000.0000 [IU] | ORAL_CAPSULE | Freq: Every day | ORAL | 3 refills | Status: DC

## 2019-01-10 MED ORDER — VITAMIN D3 1.25 MG (50000 UT) PO CAPS: 1.0000 | ORAL_CAPSULE | ORAL | 0 refills | Status: DC

## 2019-01-20 ENCOUNTER — Encounter: Payer: Self-pay | Admitting: Internal Medicine

## 2019-02-15 ENCOUNTER — Telehealth: Payer: Self-pay | Admitting: Pulmonary Disease

## 2019-02-15 DIAGNOSIS — R0683 Snoring: Secondary | ICD-10-CM

## 2019-02-15 NOTE — Telephone Encounter (Signed)
Discussed loud snoring , excessive daytime fatigue, weight gain History confirmed by wife Schedule HST - ordered

## 2019-02-26 ENCOUNTER — Ambulatory Visit: Payer: Managed Care, Other (non HMO)

## 2019-03-01 ENCOUNTER — Other Ambulatory Visit: Payer: Self-pay | Admitting: Internal Medicine

## 2019-03-09 ENCOUNTER — Ambulatory Visit: Payer: Managed Care, Other (non HMO) | Attending: Family

## 2019-03-09 DIAGNOSIS — Z23 Encounter for immunization: Secondary | ICD-10-CM | POA: Insufficient documentation

## 2019-03-09 NOTE — Progress Notes (Signed)
   Covid-19 Vaccination Clinic  Name:  Andrew Rowland    MRN: [id number] DOB: 09/20/1960  03/09/2019  [name] was observed post Covid-19 immunization for 15 minutes without incidence. He was provided with Vaccine Information Sheet and instruction to access the V-Safe system.   [name] was instructed to call 911 with any severe reactions post vaccine: Marland Kitchen Difficulty breathing  . Swelling of your face and throat  . A fast heartbeat  . A bad rash all over your body  . Dizziness and weakness    Immunizations Administered    Name Date Dose VIS Date Route   Moderna COVID-19 Vaccine 03/09/2019  2:49 PM 0.5 mL 12/16/2018 Intramuscular   Manufacturer: Moderna   Lot: [id number]   [id number]

## 2019-03-27 ENCOUNTER — Other Ambulatory Visit: Payer: Self-pay

## 2019-03-27 ENCOUNTER — Ambulatory Visit: Payer: 59

## 2019-03-27 DIAGNOSIS — R0683 Snoring: Secondary | ICD-10-CM

## 2019-03-31 ENCOUNTER — Telehealth: Payer: Self-pay | Admitting: Pulmonary Disease

## 2019-03-31 DIAGNOSIS — G4733 Obstructive sleep apnea (adult) (pediatric): Secondary | ICD-10-CM

## 2019-03-31 DIAGNOSIS — R0683 Snoring: Secondary | ICD-10-CM

## 2019-03-31 NOTE — Telephone Encounter (Signed)
HST showed moderate OSA 26/hour, severe in the supine position 47/hour Suggest trial of auto CPAP 5 to 15 cm, mask of choice

## 2019-04-01 NOTE — Telephone Encounter (Signed)
Discussed results with pt. He feels that he did not sleep well that night & HST results may not be accurate. Please order repeat HST

## 2019-04-01 NOTE — Telephone Encounter (Signed)
HST order placed.  Nothing further at this time.

## 2019-04-02 DIAGNOSIS — G4733 Obstructive sleep apnea (adult) (pediatric): Secondary | ICD-10-CM

## 2019-04-14 ENCOUNTER — Ambulatory Visit: Payer: 59 | Attending: Family

## 2019-04-14 DIAGNOSIS — Z23 Encounter for immunization: Secondary | ICD-10-CM

## 2019-04-14 NOTE — Progress Notes (Signed)
   Covid-19 Vaccination Clinic  Name:  Andrew Rowland    MRN: [id number] DOB: Mar 21, 1960  04/14/2019  [name] was observed post Covid-19 immunization for 15 minutes without incident. He was provided with Vaccine Information Sheet and instruction to access the V-Safe system.   [name] was instructed to call 911 with any severe reactions post vaccine: Marland Kitchen Difficulty breathing  . Swelling of face and throat  . A fast heartbeat  . A bad rash all over body  . Dizziness and weakness   Immunizations Administered    Name Date Dose VIS Date Route   Moderna COVID-19 Vaccine 04/14/2019  3:41 PM 0.5 mL 12/16/2018 Intramuscular   Manufacturer: Moderna   Lot: [id number]   [id number]

## 2019-04-24 ENCOUNTER — Encounter: Payer: Self-pay | Admitting: Pulmonary Disease

## 2019-05-04 ENCOUNTER — Ambulatory Visit: Payer: Managed Care, Other (non HMO) | Admitting: Internal Medicine

## 2019-05-18 ENCOUNTER — Ambulatory Visit: Payer: 59 | Admitting: Internal Medicine

## 2019-07-06 ENCOUNTER — Other Ambulatory Visit: Payer: Self-pay | Admitting: Internal Medicine

## 2019-08-27 ENCOUNTER — Ambulatory Visit (INDEPENDENT_AMBULATORY_CARE_PROVIDER_SITE_OTHER): Payer: 59 | Admitting: Internal Medicine

## 2019-08-27 ENCOUNTER — Encounter: Payer: Self-pay | Admitting: Internal Medicine

## 2019-08-27 ENCOUNTER — Other Ambulatory Visit: Payer: Self-pay

## 2019-08-27 DIAGNOSIS — R609 Edema, unspecified: Secondary | ICD-10-CM | POA: Insufficient documentation

## 2019-08-27 DIAGNOSIS — I10 Essential (primary) hypertension: Secondary | ICD-10-CM | POA: Diagnosis not present

## 2019-08-27 DIAGNOSIS — R600 Localized edema: Secondary | ICD-10-CM | POA: Diagnosis not present

## 2019-08-27 NOTE — Progress Notes (Signed)
Subjective:  Patient ID: Andrew Rowland, male    DOB: 14-Mar-1960  Age: 59 y.o. MRN: [id number]  CC: No chief complaint on file.   HPI Andrew Rowland presents for wt gain, ankle swelling.  No chest pain or shortness of breath.  Stamina has a good exercise stamina.  The swelling is intermittent  Outpatient Medications Prior to Visit  Medication Sig Dispense Refill  . amLODipine (NORVASC) 5 MG tablet TAKE 1 TABLET BY MOUTH EVERY DAY 90 tablet 3  . aspirin 81 MG tablet Take 81 mg by mouth daily.      . Cholecalciferol (VITAMIN D3) 50 MCG (2000 UT) capsule Take 1 capsule (2,000 Units total) by mouth daily. 100 capsule 3  . [facility]S VITAMIN B12 1000 MCG tablet TAKE 1 TABLET BY MOUTH EVERY DAY (Patient taking differently: every other day. ) 90 tablet 3  . dexlansoprazole (DEXILANT) 60 MG capsule Take 1 capsule (60 mg total) by mouth daily. 90 capsule 3  . fluticasone furoate-vilanterol (BREO ELLIPTA) 100-25 MCG/INH AEPB Inhale 1 puff into the lungs daily. 1 each 5  . Cholecalciferol (VITAMIN D3) 1.25 MG (50000 UT) CAPS Take 1 capsule by mouth once a week. (Patient not taking: Reported on 08/27/2019) 6 capsule 0   No facility-administered medications prior to visit.    ROS: Review of Systems  Constitutional: Positive for unexpected weight change. Negative for appetite change and fatigue.  HENT: Negative for congestion, nosebleeds, sneezing, sore throat and trouble swallowing.   Eyes: Negative for itching and visual disturbance.  Respiratory: Negative for cough, shortness of breath and wheezing.   Cardiovascular: Positive for leg swelling. Negative for chest pain and palpitations.  Gastrointestinal: Negative for abdominal distention, blood in stool, diarrhea and nausea.  Genitourinary: Negative for frequency and hematuria.  Musculoskeletal: Negative for back pain, gait problem, joint swelling and neck pain.  Skin: Negative for rash.  Neurological: Negative for dizziness, tremors, speech  difficulty and weakness.  Psychiatric/Behavioral: Negative for agitation, dysphoric mood and sleep disturbance. The patient is not nervous/anxious.     Objective:  BP 126/88 (BP Location: Right Arm, Patient Position: Sitting, Cuff Size: Large)   Pulse 83   Temp 98.2 F (36.8 C) (Oral)   Ht 6' (1.829 m)   Wt 225 lb (102.1 kg)   SpO2 96%   BMI 30.52 kg/m   BP Readings from Last 3 Encounters:  08/27/19 126/88  01/05/19 128/84  02/14/18 114/72    Wt Readings from Last 3 Encounters:  08/27/19 225 lb (102.1 kg)  01/05/19 226 lb (102.5 kg)  02/14/18 217 lb (98.4 kg)    Physical Exam Constitutional:      General: He is not in acute distress.    Appearance: He is well-developed.     Comments: NAD  Eyes:     Conjunctiva/sclera: Conjunctivae normal.     Pupils: Pupils are equal, round, and reactive to light.  Neck:     Thyroid: No thyromegaly.     Vascular: No JVD.  Cardiovascular:     Rate and Rhythm: Normal rate and regular rhythm.     Heart sounds: Normal heart sounds. No murmur heard.  No friction rub. No gallop.   Pulmonary:     Effort: Pulmonary effort is normal. No respiratory distress.     Breath sounds: Normal breath sounds. No wheezing or rales.  Chest:     Chest wall: No tenderness.  Abdominal:     General: Bowel sounds are normal. There is no distension.  Palpations: Abdomen is soft. There is no mass.     Tenderness: There is no abdominal tenderness. There is no guarding or rebound.  Musculoskeletal:        General: No tenderness. Normal range of motion.     Cervical back: Normal range of motion.  Lymphadenopathy:     Cervical: No cervical adenopathy.  Skin:    General: Skin is warm and dry.     Findings: No rash.  Neurological:     Mental Status: He is alert and oriented to person, place, and time.     Cranial Nerves: No cranial nerve deficit.     Motor: No abnormal muscle tone.     Coordination: Coordination normal.     Gait: Gait normal.      Deep Tendon Reflexes: Reflexes are normal and symmetric.  Psychiatric:        Behavior: Behavior normal.        Thought Content: Thought content normal.        Judgment: Judgment normal.     Lab Results  Component Value Date   WBC 4.8 01/05/2019   HGB 15.3 01/05/2019   HCT 45.3 01/05/2019   PLT 239.0 01/05/2019   GLUCOSE 96 01/05/2019   CHOL 197 01/05/2019   TRIG 94.0 01/05/2019   HDL 45.10 01/05/2019   LDLCALC 133 (H) 01/05/2019   ALT 14 01/05/2019   AST 15 01/05/2019   NA 139 01/05/2019   K 3.9 01/05/2019   CL 103 01/05/2019   CREATININE 0.99 01/05/2019   BUN 16 01/05/2019   CO2 27 01/05/2019   TSH 2.57 01/05/2019   PSA 2.79 01/05/2019    DG Chest 2 View  Result Date: 02/06/2018 CLINICAL DATA:  Cough for 10 days EXAM: CHEST - 2 VIEW COMPARISON:  01/22/2013 FINDINGS: Normal heart size. Lungs clear. No pneumothorax. No pleural effusion. IMPRESSION: No active cardiopulmonary disease. Electronically Signed   By: Marybelle Killings M.D.   On: 02/06/2018 08:14    Assessment & Plan:   There are no diagnoses linked to this encounter.   No orders of the defined types were placed in this encounter.    Follow-up: No follow-ups on file.  Walker Kehr, MD

## 2019-08-27 NOTE — Assessment & Plan Note (Addendum)
Labs CM is not willing to switch from amlodipine in spite of intermittent ankle swelling.  He is planning to lose weight.

## 2019-08-28 LAB — COMPLETE METABOLIC PANEL WITH GFR
AG Ratio: 1.8 (calc) (ref 1.0–2.5)
ALT: 14 U/L (ref 9–46)
AST: 16 U/L (ref 10–35)
Albumin: 4.4 g/dL (ref 3.6–5.1)
Alkaline phosphatase (APISO): 56 U/L (ref 35–144)
BUN: 14 mg/dL (ref 7–25)
CO2: 30 mmol/L (ref 20–32)
Calcium: 9.3 mg/dL (ref 8.6–10.3)
Chloride: 105 mmol/L (ref 98–110)
Creat: 1.11 mg/dL (ref 0.70–1.33)
GFR, Est African American: 84 mL/min/{1.73_m2} (ref >=60)
GFR, Est Non African American: 72 mL/min/{1.73_m2} (ref >=60)
Globulin: 2.4 g/dL (calc) (ref 1.9–3.7)
Glucose, Bld: 97 mg/dL (ref 65–99)
Potassium: 4.3 mmol/L (ref 3.5–5.3)
Sodium: 139 mmol/L (ref 135–146)
Total Bilirubin: 0.5 mg/dL (ref 0.2–1.2)
Total Protein: 6.8 g/dL (ref 6.1–8.1)

## 2019-08-28 LAB — D-DIMER, QUANTITATIVE: D-Dimer, Quant: 0.24 mcg/mL FEU (ref <0.50)

## 2019-08-28 LAB — BRAIN NATRIURETIC PEPTIDE: Brain Natriuretic Peptide: 16 pg/mL (ref <100)

## 2019-08-30 ENCOUNTER — Encounter: Payer: Self-pay | Admitting: Internal Medicine

## 2019-08-30 NOTE — Assessment & Plan Note (Signed)
CM is not willing to switch from amlodipine in spite of intermittent ankle swelling.  He is planning to lose weight.

## 2019-09-22 ENCOUNTER — Encounter (HOSPITAL_COMMUNITY): Payer: Self-pay | Admitting: Obstetrics and Gynecology

## 2019-09-22 ENCOUNTER — Emergency Department (HOSPITAL_COMMUNITY)
Admission: EM | Admit: 2019-09-22 | Discharge: 2019-09-22 | Disposition: A | Discharge status: Home / Self Care | Payer: 59 | Attending: Emergency Medicine | Admitting: Emergency Medicine

## 2019-09-22 ENCOUNTER — Other Ambulatory Visit: Payer: Self-pay

## 2019-09-22 DIAGNOSIS — Z5321 Procedure and treatment not carried out due to patient leaving prior to being seen by health care provider: Secondary | ICD-10-CM | POA: Diagnosis not present

## 2019-09-22 DIAGNOSIS — X58XXXA Exposure to other specified factors, initial encounter: Secondary | ICD-10-CM | POA: Insufficient documentation

## 2019-09-22 DIAGNOSIS — S91312A Laceration without foreign body, left foot, initial encounter: Secondary | ICD-10-CM | POA: Diagnosis present

## 2019-09-22 DIAGNOSIS — Y999 Unspecified external cause status: Secondary | ICD-10-CM | POA: Diagnosis not present

## 2019-09-22 DIAGNOSIS — Y939 Activity, unspecified: Secondary | ICD-10-CM | POA: Diagnosis not present

## 2019-09-22 DIAGNOSIS — Y929 Unspecified place or not applicable: Secondary | ICD-10-CM | POA: Diagnosis not present

## 2019-09-22 NOTE — ED Triage Notes (Signed)
Patient reports to the ER for laceration of his left foot. Patient has a 1cm laceration to the top of the foot that he reports is oozing.

## 2020-02-02 ENCOUNTER — Ambulatory Visit (INDEPENDENT_AMBULATORY_CARE_PROVIDER_SITE_OTHER): Payer: 59 | Admitting: Internal Medicine

## 2020-02-02 ENCOUNTER — Ambulatory Visit: Payer: 59 | Admitting: Internal Medicine

## 2020-02-02 ENCOUNTER — Other Ambulatory Visit: Payer: Self-pay

## 2020-02-02 ENCOUNTER — Encounter: Payer: Self-pay | Admitting: Internal Medicine

## 2020-02-02 VITALS — BP 130/84 | HR 91 | Temp 98.7°F | Ht 72.0 in | Wt 231.4 lb

## 2020-02-02 DIAGNOSIS — Z0000 Encounter for general adult medical examination without abnormal findings: Secondary | ICD-10-CM | POA: Diagnosis not present

## 2020-02-02 DIAGNOSIS — E559 Vitamin D deficiency, unspecified: Secondary | ICD-10-CM | POA: Diagnosis not present

## 2020-02-02 DIAGNOSIS — E785 Hyperlipidemia, unspecified: Secondary | ICD-10-CM

## 2020-02-02 DIAGNOSIS — M65319 Trigger thumb, unspecified thumb: Secondary | ICD-10-CM | POA: Insufficient documentation

## 2020-02-02 DIAGNOSIS — K219 Gastro-esophageal reflux disease without esophagitis: Secondary | ICD-10-CM | POA: Diagnosis not present

## 2020-02-02 DIAGNOSIS — E538 Deficiency of other specified B group vitamins: Secondary | ICD-10-CM | POA: Diagnosis not present

## 2020-02-02 DIAGNOSIS — Z125 Encounter for screening for malignant neoplasm of prostate: Secondary | ICD-10-CM | POA: Diagnosis not present

## 2020-02-02 DIAGNOSIS — M65311 Trigger thumb, right thumb: Secondary | ICD-10-CM | POA: Diagnosis not present

## 2020-02-02 DIAGNOSIS — J4521 Mild intermittent asthma with (acute) exacerbation: Secondary | ICD-10-CM

## 2020-02-02 DIAGNOSIS — I10 Essential (primary) hypertension: Secondary | ICD-10-CM

## 2020-02-02 LAB — COMPREHENSIVE METABOLIC PANEL
ALT: 14 U/L (ref 0–53)
AST: 19 U/L (ref 0–37)
Albumin: 4.7 g/dL (ref 3.5–5.2)
Alkaline Phosphatase: 66 U/L (ref 39–117)
BUN: 12 mg/dL (ref 6–23)
CO2: 27 mEq/L (ref 19–32)
Calcium: 9.6 mg/dL (ref 8.4–10.5)
Chloride: 99 mEq/L (ref 96–112)
Creatinine, Ser: 1.07 mg/dL (ref 0.40–1.50)
GFR: 75.65 mL/min (ref >60.00)
Glucose, Bld: 91 mg/dL (ref 70–99)
Potassium: 4.2 mEq/L (ref 3.5–5.1)
Sodium: 135 mEq/L (ref 135–145)
Total Bilirubin: 0.8 mg/dL (ref 0.2–1.2)
Total Protein: 7.3 g/dL (ref 6.0–8.3)

## 2020-02-02 LAB — CBC WITH DIFFERENTIAL/PLATELET
Basophils Absolute: 0.1 10*3/uL (ref 0.0–0.1)
Basophils Relative: 1 % (ref 0.0–3.0)
Eosinophils Absolute: 0.2 10*3/uL (ref 0.0–0.7)
Eosinophils Relative: 2.5 % (ref 0.0–5.0)
HCT: 46.8 % (ref 39.0–52.0)
Hemoglobin: 15.6 g/dL (ref 13.0–17.0)
Lymphocytes Relative: 27.8 % (ref 12.0–46.0)
Lymphs Abs: 2.1 10*3/uL (ref 0.7–4.0)
MCHC: 33.3 g/dL (ref 30.0–36.0)
MCV: 91.2 fl (ref 78.0–100.0)
Monocytes Absolute: 0.9 10*3/uL (ref 0.1–1.0)
Monocytes Relative: 11.3 % (ref 3.0–12.0)
Neutro Abs: 4.4 10*3/uL (ref 1.4–7.7)
Neutrophils Relative %: 57.4 % (ref 43.0–77.0)
Platelets: 258 10*3/uL (ref 150.0–400.0)
RBC: 5.13 Mil/uL (ref 4.22–5.81)
RDW: 14.5 % (ref 11.5–15.5)
WBC: 7.7 10*3/uL (ref 4.0–10.5)

## 2020-02-02 LAB — URINALYSIS
Bilirubin Urine: NEGATIVE
Hgb urine dipstick: NEGATIVE
Ketones, ur: NEGATIVE
Leukocytes,Ua: NEGATIVE
Nitrite: NEGATIVE
Specific Gravity, Urine: <=1.005 — AB (ref 1.000–1.030)
Total Protein, Urine: NEGATIVE
Urine Glucose: NEGATIVE
Urobilinogen, UA: 0.2 (ref 0.0–1.0)
pH: 6.5 (ref 5.0–8.0)

## 2020-02-02 LAB — LIPID PANEL
Cholesterol: 191 mg/dL (ref 0–200)
HDL: 47.2 mg/dL (ref >39.00)
LDL Cholesterol: 124 mg/dL — ABNORMAL HIGH (ref 0–99)
NonHDL: 144.22
Total CHOL/HDL Ratio: 4
Triglycerides: 99 mg/dL (ref 0.0–149.0)
VLDL: 19.8 mg/dL (ref 0.0–40.0)

## 2020-02-02 LAB — TSH: TSH: 3.22 u[IU]/mL (ref 0.35–4.50)

## 2020-02-02 LAB — H. PYLORI ANTIBODY, IGG: H Pylori IgG: NEGATIVE

## 2020-02-02 MED ORDER — DEXLANSOPRAZOLE 60 MG PO CPDR: 60.0000 mg | DELAYED_RELEASE_CAPSULE | Freq: Every day | ORAL | 3 refills | Status: DC

## 2020-02-02 MED ORDER — BREO ELLIPTA 100-25 MCG/INH IN AEPB: 1.0000 | INHALATION_SPRAY | Freq: Every day | RESPIRATORY_TRACT | 11 refills | Status: DC

## 2020-02-02 MED ORDER — FAMOTIDINE 40 MG PO TABS: 40.0000 mg | ORAL_TABLET | Freq: Every day | ORAL | 3 refills | Status: DC

## 2020-02-02 MED ORDER — DICLOFENAC SODIUM 1 % EX GEL
1.0000 | Freq: Four times a day (QID) | CUTANEOUS | 3 refills | Status: DC
Start: 2020-02-02 — End: 2020-12-03

## 2020-02-02 NOTE — Patient Instructions (Signed)
Trigger Finger  Trigger finger, also called stenosing tenosynovitis,  is a condition that causes a finger to get stuck in a bent position. Each finger has a tendon, which is a tough, cord-like tissue that connects muscle to bone, and each tendon passes through a tunnel of tissue called a tendon sheath. To move your finger, your tendon needs to glide freely through the sheath. Trigger finger happens when the tendon or the sheath thickens, making it difficult to move your finger. Trigger finger can affect any finger or a thumb. It may affect more than one finger. Mild cases may clear up with rest and medicine. Severe cases require more treatment. What are the causes? Trigger finger is caused by a thickened finger tendon or tendon sheath. The cause of this thickening is not known. What increases the risk? The following factors may make you more likely to develop this condition:  Doing activities that require a strong grip.  Having rheumatoid arthritis, gout, or diabetes.  Being 58-108 years old.  Being male. What are the signs or symptoms? Symptoms of this condition include:  Pain when bending or straightening your finger.  Tenderness or swelling where your finger attaches to the palm of your hand.  A lump in the palm of your hand or on the inside of your finger.  Hearing a noise like a pop or a snap when you try to straighten your finger.  Feeling a catching or locking sensation when you try to straighten your finger.  Being unable to straighten your finger. How is this diagnosed? This condition is diagnosed based on your symptoms and a physical exam. How is this treated? This condition may be treated by:  Resting your finger and avoiding activities that make symptoms worse.  Wearing a finger splint to keep your finger extended.  Taking NSAIDs, such as ibuprofen, to relieve pain and swelling.  Doing gentle exercises to stretch the finger as told by your health care  provider.  Having medicine that reduces swelling and inflammation (steroids) injected into the tendon sheath. Injections may need to be repeated.  Having surgery to open the tendon sheath. This may be done if other treatments do not work and you cannot straighten your finger. You may need physical therapy after surgery. Follow these instructions at home: If you have a splint:  Wear the splint as told by your health care provider. Remove it only as told by your health care provider.  Loosen it if your fingers tingle, become numb, or turn cold and blue.  Keep it clean.  If the splint is not waterproof: ? Do not let it get wet. ? Cover it with a watertight covering when you take a bath or shower. Managing pain, stiffness, and swelling If directed, apply heat to the affected area as often as told by your health care provider. Use the heat source that your health care provider recommends, such as a moist heat pack or a heating pad.  Place a towel between your skin and the heat source.  Leave the heat on for 20-30 minutes.  Remove the heat if your skin turns bright red. This is especially important if you are unable to feel pain, heat, or cold. You may have a greater risk of getting burned. If directed, put ice on the painful area. To do this:  If you have a removable splint, remove it as told by your health care provider.  Put ice in a plastic bag.  Place a towel between your skin  and the bag or between your splint and the bag.  Leave the ice on for 20 minutes, 2-3 times a day.      Activity  Rest your finger as told by your health care provider. Avoid activities that make the pain worse.  Return to your normal activities as told by your health care provider. Ask your health care provider what activities are safe for you.  Do exercises as told by your health care provider.  Ask your health care provider when it is safe to drive if you have a splint on your hand. General  instructions  Take over-the-counter and prescription medicines only as told by your health care provider.  Keep all follow-up visits as told by your health care provider. This is important. Contact a health care provider if:  Your symptoms are not improving with home care. Summary  Trigger finger, also called stenosing tenosynovitis, causes your finger to get stuck in a bent position. This can make it difficult and painful to straighten your finger.  This condition develops when a finger tendon or tendon sheath thickens.  Treatment may include resting your finger, wearing a splint, and taking medicines.  In severe cases, surgery to open the tendon sheath may be needed. This information is not intended to replace advice given to you by your health care provider. Make sure you discuss any questions you have with your health care provider. Document Revised: 05/19/2018 Document Reviewed: 05/19/2018 Elsevier Patient Education  [place].   Cardiac CT calcium scoring test $150 Tel # is [phone]   Computed tomography, more commonly known as a CT or CAT scan, is a diagnostic medical imaging test. Like traditional x-rays, it produces multiple images or pictures of the inside of the body. The cross-sectional images generated during a CT scan can be reformatted in multiple planes. They can even generate three-dimensional images. These images can be viewed on a computer monitor, printed on film or by a 3D printer, or transferred to a CD or DVD. CT images of internal organs, bones, soft tissue and blood vessels provide greater detail than traditional x-rays, particularly of soft tissues and blood vessels. A cardiac CT scan for coronary calcium is a non-invasive way of obtaining information about the presence, location and extent of calcified plaque in the coronary arteries-the vessels that supply oxygen-containing blood to the heart muscle. Calcified plaque results when there is a  build-up of fat and other substances under the inner layer of the artery. This material can calcify which signals the presence of atherosclerosis, a disease of the vessel wall, also called coronary artery disease (CAD). People with this disease have an increased risk for heart attacks. In addition, over time, progression of plaque build up (CAD) can narrow the arteries or even close off blood flow to the heart. The result may be chest pain, sometimes called "angina," or a heart attack. Because calcium is a marker of CAD, the amount of calcium detected on a cardiac CT scan is a helpful prognostic tool. The findings on cardiac CT are expressed as a calcium score. Another name for this test is coronary artery calcium scoring.  What are some common uses of the procedure? The goal of cardiac CT scan for calcium scoring is to determine if CAD is present and to what extent, even if there are no symptoms. It is a screening study that may be recommended by a physician for patients with risk factors for CAD but no clinical symptoms. The major risk  factors for CAD are: . high blood cholesterol levels  . family history of heart attacks  . diabetes  . high blood pressure  . cigarette smoking  . overweight or obese  . physical inactivity   A negative cardiac CT scan for calcium scoring shows no calcification within the coronary arteries. This suggests that CAD is absent or so minimal it cannot be seen by this technique. The chance of having a heart attack over the next two to five years is very low under these circumstances. A positive test means that CAD is present, regardless of whether or not the patient is experiencing any symptoms. The amount of calcification-expressed as the calcium score-may help to predict the likelihood of a myocardial infarction (heart attack) in the coming years and helps your medical doctor or cardiologist decide whether the patient may need to take preventive medicine or undertake  other measures such as diet and exercise to lower the risk for heart attack. The extent of CAD is graded according to your calcium score:  Calcium Score  Presence of CAD (coronary artery disease)  0 No evidence of CAD   1-10 Minimal evidence of CAD  11-100 Mild evidence of CAD  101-400 Moderate evidence of CAD  Over 400 Extensive evidence of CAD

## 2020-02-02 NOTE — Assessment & Plan Note (Signed)
On B12 

## 2020-02-02 NOTE — Assessment & Plan Note (Signed)
On Vit D 

## 2020-02-02 NOTE — Assessment & Plan Note (Signed)
Borderline blood pressure.  On amlodipine.  We will watch

## 2020-02-02 NOTE — Assessment & Plan Note (Addendum)
R thumb-new Orthopedic surgery consultation-Dr. Amedeo Plenty. She will continue with a thumb splint.  He can use Voltaren gel

## 2020-02-02 NOTE — Progress Notes (Signed)
Subjective:  Patient ID: Andrew Rowland, male    DOB: 02/08/1960  Age: 60 y.o. MRN: [id number]  CC: Annual Exam   HPI Andrew Rowland presents for well exam.  She also has a number of complaints.  C/o R ?trigger thumb - bad episodic pain and catching.  She was wearing a thumb splint for 4 weeks which helped.  C/o L rib locking at times when he bends and twists to the left  C/o GERD - worse in spite of taking Dexilant.  He stopped Dexilant for a few days.  Occasional dysphagia.    Outpatient Medications Prior to Visit  Medication Sig Dispense Refill  . amLODipine (NORVASC) 5 MG tablet TAKE 1 TABLET BY MOUTH EVERY DAY 90 tablet 3  . aspirin 81 MG tablet Take 81 mg by mouth daily.    . Cholecalciferol (VITAMIN D3) 50 MCG (2000 UT) capsule Take 1 capsule (2,000 Units total) by mouth daily. 100 capsule 3  . [facility] VITAMIN B12 1000 MCG tablet TAKE 1 TABLET BY MOUTH EVERY DAY (Patient taking differently: every other day.) 90 tablet 3  . dexlansoprazole (DEXILANT) 60 MG capsule Take 1 capsule (60 mg total) by mouth daily. (Patient not taking: Reported on 02/02/2020) 90 capsule 3  . fluticasone furoate-vilanterol (BREO ELLIPTA) 100-25 MCG/INH AEPB Inhale 1 puff into the lungs daily. (Patient not taking: Reported on 02/02/2020) 1 each 5   No facility-administered medications prior to visit.    ROS: Review of Systems  Constitutional: Positive for unexpected weight change. Negative for appetite change and fatigue.  HENT: Negative for congestion, nosebleeds, sneezing, sore throat and trouble swallowing.   Eyes: Negative for itching and visual disturbance.  Respiratory: Negative for cough.   Cardiovascular: Negative for chest pain, palpitations and leg swelling.  Gastrointestinal: Negative for abdominal distention, blood in stool, diarrhea and nausea.  Genitourinary: Negative for frequency and hematuria.  Musculoskeletal: Positive for arthralgias. Negative for back pain, gait problem, joint  swelling and neck pain.  Skin: Negative for rash.  Neurological: Negative for dizziness, tremors, speech difficulty and weakness.  Psychiatric/Behavioral: Negative for agitation, dysphoric mood and sleep disturbance. The patient is not nervous/anxious.     Objective:  BP 130/84 (BP Location: Left Arm)   Pulse 91   Temp 98.7 F (37.1 C) (Oral)   Ht 6' (1.829 m)   Wt 231 lb 6.4 oz (105 kg)   SpO2 96%   BMI 31.38 kg/m   BP Readings from Last 3 Encounters:  02/02/20 130/84  08/27/19 126/88  01/05/19 128/84    Wt Readings from Last 3 Encounters:  02/02/20 231 lb 6.4 oz (105 kg)  08/27/19 225 lb (102.1 kg)  01/05/19 226 lb (102.5 kg)    Physical Exam Constitutional:      General: He is not in acute distress.    Appearance: He is well-developed.     Comments: NAD  HENT:     Mouth/Throat:     Mouth: Oropharynx is clear and moist.  Eyes:     Conjunctiva/sclera: Conjunctivae normal.     Pupils: Pupils are equal, round, and reactive to light.  Neck:     Thyroid: No thyromegaly.     Vascular: No JVD.  Cardiovascular:     Rate and Rhythm: Normal rate and regular rhythm.     Pulses: Intact distal pulses.     Heart sounds: Normal heart sounds. No murmur heard. No friction rub. No gallop.   Pulmonary:     Effort: Pulmonary  effort is normal. No respiratory distress.     Breath sounds: Normal breath sounds. No wheezing or rales.  Chest:     Chest wall: No tenderness.  Abdominal:     General: Bowel sounds are normal. There is no distension.     Palpations: Abdomen is soft. There is no mass.     Tenderness: There is no abdominal tenderness. There is no guarding or rebound.  Musculoskeletal:        General: No tenderness or edema. Normal range of motion.     Cervical back: Normal range of motion.  Lymphadenopathy:     Cervical: No cervical adenopathy.  Skin:    General: Skin is warm and dry.     Findings: No rash.  Neurological:     Mental Status: He is alert and oriented  to person, place, and time.     Cranial Nerves: No cranial nerve deficit.     Motor: No abnormal muscle tone.     Coordination: He displays a negative Romberg sign. Coordination normal.     Gait: Gait normal.     Deep Tendon Reflexes: Reflexes are normal and symmetric.  Psychiatric:        Mood and Affect: Mood and affect normal.        Behavior: Behavior normal.        Thought Content: Thought content normal.        Judgment: Judgment normal.   Right thumb tender at the base with palpable nodule Rib cage is nontender Rectal-Per gastroenterology  I spent 22 minutes in addition to time for CPX wellness examination in preparing to see the patient by review of recent labs, imaging and procedures, obtaining and reviewing separately obtained history, communicating with the patient, ordering medications, tests or procedures, and documenting clinical information in the EHR including the differential diagnosis, treatment, and any further evaluation and other management of  trigger thumb, uncontrolled GERD with dysphagia, rib cage pain.         Lab Results  Component Value Date   WBC 4.8 01/05/2019   HGB 15.3 01/05/2019   HCT 45.3 01/05/2019   PLT 239.0 01/05/2019   GLUCOSE 97 08/27/2019   CHOL 197 01/05/2019   TRIG 94.0 01/05/2019   HDL 45.10 01/05/2019   LDLCALC 133 (H) 01/05/2019   ALT 14 08/27/2019   AST 16 08/27/2019   NA 139 08/27/2019   K 4.3 08/27/2019   CL 105 08/27/2019   CREATININE 1.11 08/27/2019   BUN 14 08/27/2019   CO2 30 08/27/2019   TSH 2.57 01/05/2019   PSA 2.79 01/05/2019    DG Chest 2 View  Result Date: 02/06/2018 CLINICAL DATA:  Cough for 10 days EXAM: CHEST - 2 VIEW COMPARISON:  01/22/2013 FINDINGS: Normal heart size. Lungs clear. No pneumothorax. No pleural effusion. IMPRESSION: No active cardiopulmonary disease. Electronically Signed   By: Marybelle Killings M.D.   On: 02/06/2018 08:14    Assessment & Plan:    Walker Kehr, MD

## 2020-02-02 NOTE — Assessment & Plan Note (Signed)
  We discussed age appropriate health related issues, including available/recomended screening tests and vaccinations. Labs were ordered to be later reviewed . All questions were answered. We discussed one or more of the following - seat belt use, use of sunscreen/sun exposure exercise, safe sex, fall risk reduction, second hand smoke exposure, firearm use and storage, seat belt use, a need for adhering to healthy diet and exercise. Labs were ordered.  All questions were answered.  CM had a nl colon 2014 Dr Lyndel Safe Shingrix discussed CT ca scoring discussed 12/19, ordered 1/22

## 2020-02-02 NOTE — Assessment & Plan Note (Addendum)
Chronic, worse.  There is symptoms of dysphagia present now.  Obtain H. pylori. F/u GI consultation w/Dr Arelia Sneddon- centimeters is probably due to colonoscopy and upper endoscopy. Restart Dexilant.  Added Pepcid

## 2020-02-02 NOTE — Assessment & Plan Note (Signed)
Continue with Breo daily.  CM is a runner.  Breo should help him to run better

## 2020-02-03 LAB — VITAMIN D 25 HYDROXY (VIT D DEFICIENCY, FRACTURES): VITD: 35.44 ng/mL (ref 30.00–100.00)

## 2020-02-03 LAB — PSA: PSA: 2.87 ng/mL (ref 0.10–4.00)

## 2020-02-03 LAB — VITAMIN B12: Vitamin B-12: 1016 pg/mL — ABNORMAL HIGH (ref 211–911)

## 2020-02-05 ENCOUNTER — Other Ambulatory Visit: Payer: Self-pay | Admitting: Gastroenterology

## 2020-02-05 ENCOUNTER — Ambulatory Visit: Payer: 59 | Admitting: Gastroenterology

## 2020-02-05 DIAGNOSIS — K219 Gastro-esophageal reflux disease without esophagitis: Secondary | ICD-10-CM

## 2020-02-05 DIAGNOSIS — Z1211 Encounter for screening for malignant neoplasm of colon: Secondary | ICD-10-CM

## 2020-02-05 MED ORDER — CLENPIQ 10-3.5-12 MG-GM -GM/160ML PO SOLN: 1.0000 | Freq: Once | ORAL | 0 refills | Status: AC

## 2020-02-08 ENCOUNTER — Ambulatory Visit (INDEPENDENT_AMBULATORY_CARE_PROVIDER_SITE_OTHER): Payer: 59 | Admitting: Gastroenterology

## 2020-02-08 ENCOUNTER — Encounter: Payer: Self-pay | Admitting: Gastroenterology

## 2020-02-08 VITALS — BP 128/92 | HR 87 | Ht 72.0 in | Wt 232.0 lb

## 2020-02-08 DIAGNOSIS — K219 Gastro-esophageal reflux disease without esophagitis: Secondary | ICD-10-CM

## 2020-02-08 DIAGNOSIS — Z1211 Encounter for screening for malignant neoplasm of colon: Secondary | ICD-10-CM

## 2020-02-08 NOTE — Patient Instructions (Addendum)
If you are age 61 or older, your body mass index should be between 23-30. Your Body mass index is 31.46 kg/m. If this is out of the aforementioned range listed, please consider follow up with your Primary Care Provider.  If you are age 7 or younger, your body mass index should be between 19-25. Your Body mass index is 31.46 kg/m. If this is out of the aformentioned range listed, please consider follow up with your Primary Care Provider.   You have been scheduled for an endoscopy and colonoscopy. Please follow the written instructions given to you at your visit today. Please pick up your prep supplies at the pharmacy within the next 1-3 days. If you use inhalers (even only as needed), please bring them with you on the day of your procedure.   Thank you,  Dr. Jackquline Denmark

## 2020-02-08 NOTE — Progress Notes (Signed)
Chief Complaint:   Referring Provider:  Cassandria Anger, MD      ASSESSMENT AND PLAN;   #1. GERD with postprandial cough  #2. Colorectal cancer screening  Plan: -Dexilant 60mg  po qd to continue. -pepcid 40mg  po qhs to continue. -EGD/colon for further evaluation.  Discussed risks & benefits. Risks including rare perforation req laparotomy, bleeding after bx/polypectomy req blood transfusion, rarely missing neoplasms, risks of anesthesia/sedation. Benefits outweigh the risks. Patient agrees to proceed. All the questions were answered. Consent forms given for review.  HPI:    Andrew Rowland is a 60 y.o. male  With HTN, B12 def  With longstanding history of reflux which has been getting worse lately despite Dexilant.  Most recently Pepcid has been added at bedtime.  He continues to have waterbrash and an occasional heartburn -mostly at night.  No odynophagia or dysphagia.  Has been having intermittent episodic cough and at times becomes intractable.    He has been evaluated by pulmonary, neg CxR, started on inhalers.  It is thought that his cough is related to reflux. HST did show sleep apnea 03/27/2019-being followed by pulmonary.  No significant abdominal pain except for LUQ musculoskeletal pain which started after he started running.  No diarrhea or constipation.  No melena or hematochezia.  No fever chills or night sweats.  No recent unintentional weight loss.  No family history of colon cancer or colonic polyps.    Past Medical History:  Diagnosis Date  . GERD (gastroesophageal reflux disease)   . Hypertension   . Insomnia   . Vitamin B12 deficiency   . Vitamin D deficiency     Past Surgical History:  Procedure Laterality Date  . COLONOSCOPY  06/13/2012   Mild sigmoid diverticulosis. Small internal hemorrhoids. Otherwise normal colonoscopy to terminal ileum    Family History  Problem Relation Age of Onset  . Hypertension Mother   . Hypertension Father    . Hypertension Other   . Colon cancer Neg Hx     Social History   Tobacco Use  . Smoking status: Former Research scientist (life sciences)  . Smokeless tobacco: Former Network engineer  . Vaping Use: Never used  Substance Use Topics  . Alcohol use: Yes    Comment: OCC  . Drug use: No    Current Outpatient Medications  Medication Sig Dispense Refill  . amLODipine (NORVASC) 5 MG tablet TAKE 1 TABLET BY MOUTH EVERY DAY 90 tablet 3  . aspirin 81 MG tablet Take 81 mg by mouth daily.    . Cholecalciferol (VITAMIN D3) 50 MCG (2000 UT) capsule Take 1 capsule (2,000 Units total) by mouth daily. 100 capsule 3  . [facility] VITAMIN B12 1000 MCG tablet TAKE 1 TABLET BY MOUTH EVERY DAY (Patient taking differently: as directed. Take 2 times a week Wednesday and Sunday) 90 tablet 3  . dexlansoprazole (DEXILANT) 60 MG capsule Take 1 capsule (60 mg total) by mouth daily. (Patient taking differently: Take 60 mg by mouth as directed. Takes 4 times a week) 90 capsule 3  . diclofenac Sodium (VOLTAREN) 1 % GEL Apply 1 application topically 4 (four) times daily. 100 g 3  . famotidine (PEPCID) 40 MG tablet Take 1 tablet (40 mg total) by mouth daily. 90 tablet 3  . fluticasone furoate-vilanterol (BREO ELLIPTA) 100-25 MCG/INH AEPB Inhale 1 puff into the lungs daily. (Patient not taking: Reported on 02/08/2020) 1 each 11   No current facility-administered medications for this visit.    Allergies  Allergen Reactions  . Lisinopril     REACTION: cough    Review of Systems:  Constitutional: Denies fever, chills, diaphoresis, appetite change and fatigue.  HEENT: Denies photophobia, eye pain, redness, hearing loss, ear pain, congestion, sore throat, rhinorrhea, sneezing, mouth sores, neck pain, neck stiffness and tinnitus.   Respiratory: Denies SOB, DOE, cough, chest tightness,  and wheezing.   Cardiovascular: Denies chest pain, palpitations and leg swelling.  Genitourinary: Denies dysuria, urgency, frequency, hematuria, flank pain and  difficulty urinating.  Musculoskeletal: Denies myalgias, back pain, joint swelling, arthralgias and gait problem.  Skin: No rash.  Neurological: Denies dizziness, seizures, syncope, weakness, light-headedness, numbness and headaches.  Hematological: Denies adenopathy. Easy bruising, personal or family bleeding history  Psychiatric/Behavioral: No anxiety or depression     Physical Exam:    BP (!) 128/92   Pulse 87   Ht 6' (1.829 m)   Wt 232 lb (105.2 kg)   BMI 31.46 kg/m  Wt Readings from Last 3 Encounters:  02/08/20 232 lb (105.2 kg)  02/02/20 231 lb 6.4 oz (105 kg)  08/27/19 225 lb (102.1 kg)   Constitutional:  Well-developed, in no acute distress. Psychiatric: Normal mood and affect. Behavior is normal. HEENT: Pupils normal.  Conjunctivae are normal. No scleral icterus. Cardiovascular: Normal rate, regular rhythm. No edema Pulmonary/chest: Effort normal and breath sounds normal. No wheezing, rales or rhonchi. Abdominal: Soft, nondistended. Nontender. Bowel sounds active throughout. There are no masses palpable. No hepatomegaly. Rectal: Deferred Neurological: Alert and oriented to person place and time. Skin: Skin is warm and dry. No rashes noted.  Minimal extremity edema.  Data Reviewed: I have personally reviewed following labs and imaging studies  CBC: CBC Latest Ref Rng & Units 02/02/2020 01/05/2019 01/02/2018  WBC 4.0 - 10.5 K/uL 7.7 4.8 6.1  Hemoglobin 13.0 - 17.0 g/dL 15.6 15.3 15.8  Hematocrit 39.0 - 52.0 % 46.8 45.3 46.7  Platelets 150.0 - 400.0 K/uL 258.0 239.0 261.0    CMP: CMP Latest Ref Rng & Units 02/02/2020 08/27/2019 01/05/2019  Glucose 70 - 99 mg/dL 91 97 96  BUN 6 - 23 mg/dL 12 14 16   Creatinine 0.40 - 1.50 mg/dL 1.07 1.11 0.99  Sodium 135 - 145 mEq/L 135 139 139  Potassium 3.5 - 5.1 mEq/L 4.2 4.3 3.9  Chloride 96 - 112 mEq/L 99 105 103  CO2 19 - 32 mEq/L 27 30 27   Calcium 8.4 - 10.5 mg/dL 9.6 9.3 9.2  Total Protein 6.0 - 8.3 g/dL 7.3 6.8 6.8   Total Bilirubin 0.2 - 1.2 mg/dL 0.8 0.5 0.7  Alkaline Phos 39 - 117 U/L 66 - 52  AST 0 - 37 U/L 19 16 15   ALT 0 - 53 U/L 14 14 14     GFR: Estimated Creatinine Clearance: 92 mL/min (by C-G formula based on SCr of 1.07 mg/dL). Liver Function Tests: Recent Labs  Lab 02/02/20 1604  AST 19  ALT 14  ALKPHOS 66  BILITOT 0.8  PROT 7.3  ALBUMIN 4.7      Carmell Austria, MD 02/08/2020, 3:39 PM  Cc: Plotnikov, Evie Lacks, MD

## 2020-02-11 ENCOUNTER — Ambulatory Visit (AMBULATORY_SURGERY_CENTER): Payer: 59 | Admitting: Gastroenterology

## 2020-02-11 ENCOUNTER — Other Ambulatory Visit: Payer: Self-pay

## 2020-02-11 ENCOUNTER — Encounter: Payer: Self-pay | Admitting: Gastroenterology

## 2020-02-11 ENCOUNTER — Other Ambulatory Visit: Payer: Self-pay | Admitting: Internal Medicine

## 2020-02-11 ENCOUNTER — Encounter: Payer: 59 | Admitting: Gastroenterology

## 2020-02-11 VITALS — BP 119/79 | HR 82 | Temp 97.5°F | Resp 15 | Ht 72.0 in | Wt 232.0 lb

## 2020-02-11 DIAGNOSIS — K2970 Gastritis, unspecified, without bleeding: Secondary | ICD-10-CM

## 2020-02-11 DIAGNOSIS — D123 Benign neoplasm of transverse colon: Secondary | ICD-10-CM

## 2020-02-11 DIAGNOSIS — K219 Gastro-esophageal reflux disease without esophagitis: Secondary | ICD-10-CM

## 2020-02-11 DIAGNOSIS — Z1211 Encounter for screening for malignant neoplasm of colon: Secondary | ICD-10-CM

## 2020-02-11 DIAGNOSIS — K319 Disease of stomach and duodenum, unspecified: Secondary | ICD-10-CM | POA: Diagnosis not present

## 2020-02-11 DIAGNOSIS — R0683 Snoring: Secondary | ICD-10-CM

## 2020-02-11 MED ORDER — SODIUM CHLORIDE 0.9 % IV SOLN: 500.0000 mL | Freq: Once | INTRAVENOUS | Status: DC

## 2020-02-11 NOTE — Patient Instructions (Signed)
Continue taking your dexilant as ordered.  Continue with your sleep study.  You need it to confirm sleep apnea.   Read all of the handouts given to you by your recovery room nurse.    YOU HAD AN ENDOSCOPIC PROCEDURE TODAY AT [facility]:   Refer to the procedure report that was given to you for any specific questions about what was found during the examination.  If the procedure report does not answer your questions, please call your gastroenterologist to clarify.  If you requested that your care partner not be given the details of your procedure findings, then the procedure report has been included in a sealed envelope for you to review at your convenience later.  YOU SHOULD EXPECT: Some feelings of bloating in the abdomen. Passage of more gas than usual.  Walking can help get rid of the air that was put into your GI tract during the procedure and reduce the bloating. If you had a lower endoscopy (such as a colonoscopy or flexible sigmoidoscopy) you may notice spotting of blood in your stool or on the toilet paper. If you underwent a bowel prep for your procedure, you may not have a normal bowel movement for a few days.  Please Note:  You might notice some irritation and congestion in your nose or some drainage.  This is from the oxygen used during your procedure.  There is no need for concern and it should clear up in a day or so.  SYMPTOMS TO REPORT IMMEDIATELY:   Following lower endoscopy (colonoscopy or flexible sigmoidoscopy):  Excessive amounts of blood in the stool  Significant tenderness or worsening of abdominal pains  Swelling of the abdomen that is new, acute  Fever of 100F or higher   Following upper endoscopy (EGD)  Vomiting of blood or coffee ground material  New chest pain or pain under the shoulder blades  Painful or persistently difficult swallowing  New shortness of breath  Fever of 100F or higher  Black, tarry-looking stools  For urgent or emergent  issues, a gastroenterologist can be reached at any hour by calling [phone]. Do not use MyChart messaging for urgent concerns.    DIET:  We do recommend a small meal at first, but then you may proceed to your regular diet.  Drink plenty of fluids but you should avoid alcoholic beverages for 24 hours. Try to eat a high fiber diet, and drink plenty of water. ACTIVITY:  You should plan to take it easy for the rest of today and you should NOT DRIVE or use heavy machinery until tomorrow (because of the sedation medicines used during the test).    FOLLOW UP: Our staff will call the number listed on your records 48-72 hours following your procedure to check on you and address any questions or concerns that you may have regarding the information given to you following your procedure. If we do not reach you, we will leave a message.  We will attempt to reach you two times.  During this call, we will ask if you have developed any symptoms of COVID 19. If you develop any symptoms (ie: fever, flu-like symptoms, shortness of breath, cough etc.) before then, please call [phone].  If you test positive for Covid 19 in the 2 weeks post procedure, please call and report this information to [place].    If any biopsies were taken you will be contacted by phone or by letter within the next 1-3 weeks.  Please call  us at [phone] if you have not heard about the biopsies in 3 weeks.    SIGNATURES/CONFIDENTIALITY: You and/or your care partner have signed paperwork which will be entered into your electronic medical record.  These signatures attest to the fact that that the information above on your After Visit Summary has been reviewed and is understood.  Full responsibility of the confidentiality of this discharge information lies with you and/or your care-partner.

## 2020-02-11 NOTE — Progress Notes (Signed)
0845 Robinul 0.1 mg IV given due large amount of secretions upon assessment.  MD made aware, vss 

## 2020-02-11 NOTE — Op Note (Signed)
[facility] Patient Name: Chang Tiggs Procedure Date: 02/11/2020 8:25 AM MRN: [id number] Endoscopist: Jackquline Denmark , MD Age: 60 Referring MD:  Date of Birth: 03-Sep-1960 Gender: Male Account #: [id number] Procedure:                Upper GI endoscopy Indications:              GERD despite PPIs. Postprandial cough. Medicines:                Monitored Anesthesia Care Procedure:                Pre-Anesthesia Assessment:                           - Prior to the procedure, a History and Physical                            was performed, and patient medications and                            allergies were reviewed. The patient's tolerance of                            previous anesthesia was also reviewed. The risks                            and benefits of the procedure and the sedation                            options and risks were discussed with the patient.                            All questions were answered, and informed consent                            was obtained. Prior Anticoagulants: The patient has                            taken no previous anticoagulant or antiplatelet                            agents. ASA Grade Assessment: II - A patient with                            mild systemic disease. After reviewing the risks                            and benefits, the patient was deemed in                            satisfactory condition to undergo the procedure.                           After obtaining informed consent, the endoscope was  passed under direct vision. Throughout the                            procedure, the patient's blood pressure, pulse, and                            oxygen saturations were monitored continuously. The                            Endoscope was introduced through the mouth, and                            advanced to the second part of duodenum. The upper                            GI endoscopy was  accomplished without difficulty.                            The patient tolerated the procedure well. Scope In: Scope Out: Findings:                 The examined esophagus was normal. Biopsies were                            obtained from the proximal and distal esophagus                            with cold forceps for histology of suspected                            eosinophilic esophagitis.                           The Z-line was irregular and was found 40 cm from                            the incisors. Biopsies were taken with a cold                            forceps for histology, directed by [facility].                           A small hiatal hernia was present.                           Localized mild inflammation characterized by                            erythema was found in the gastric antrum. Biopsies                            were taken with a cold forceps for histology.                            Retroflexed  examination of the cardia revealed GE                            junction to be Hill's grade II.                           The examined duodenum was normal. Biopsies for                            histology were taken with a cold forceps for                            evaluation of celiac disease. Complications:            No immediate complications. Estimated Blood Loss:     Estimated blood loss: none. Impression:               - Small hiatal hernia.                           - Minimal gastritis. Recommendation:           - Patient has a contact number available for                            emergencies. The signs and symptoms of potential                            delayed complications were discussed with the                            patient. Return to normal activities tomorrow.                            Written discharge instructions were provided to the                            patient.                           - Resume previous diet.                            - Continue present medications including Dexilant                            60 mg p.o. once a day.                           - Await pathology results.                           - Follow an antireflux regimen. Proceed with home                            sleep study. Jackquline Denmark, MD 02/11/2020 9:20:01 AM This report has been signed electronically.

## 2020-02-11 NOTE — Op Note (Signed)
[facility] Patient Name: Andrew Rowland Procedure Date: 02/11/2020 8:25 AM MRN: [id number] Endoscopist: Jackquline Denmark , MD Age: 60 Referring MD:  Date of Birth: January 31, 1960 Gender: Male Account #: [id number] Procedure:                Colonoscopy Indications:              Screening for colorectal malignant neoplasm Medicines:                Monitored Anesthesia Care Procedure:                Pre-Anesthesia Assessment:                           - Prior to the procedure, a History and Physical                            was performed, and patient medications and                            allergies were reviewed. The patient's tolerance of                            previous anesthesia was also reviewed. The risks                            and benefits of the procedure and the sedation                            options and risks were discussed with the patient.                            All questions were answered, and informed consent                            was obtained. Prior Anticoagulants: The patient has                            taken no previous anticoagulant or antiplatelet                            agents. ASA Grade Assessment: II - A patient with                            mild systemic disease. After reviewing the risks                            and benefits, the patient was deemed in                            satisfactory condition to undergo the procedure.                           After obtaining informed consent, the colonoscope  was passed under direct vision. Throughout the                            procedure, the patient's blood pressure, pulse, and                            oxygen saturations were monitored continuously. The                            Olympus [id number] Colonoscope was                            introduced through the anus and advanced to the 2                            cm into the ileum. The  colonoscopy was performed                            without difficulty. The patient tolerated the                            procedure well. The quality of the bowel                            preparation was good. The terminal ileum, ileocecal                            valve, appendiceal orifice, and rectum were                            photographed. Scope In: 8:56:07 AM Scope Out: 9:08:53 AM Scope Withdrawal Time: 0 hours 10 minutes 38 seconds  Total Procedure Duration: 0 hours 12 minutes 46 seconds  Findings:                 A 6 mm polyp was found in the proximal transverse                            colon. The polyp was sessile. The polyp was removed                            with a cold snare. Resection and retrieval were                            complete.                           A few small-mouthed diverticula were found in the                            sigmoid colon (rare) and ascending colon (few).                           Non-bleeding internal hemorrhoids were found during  retroflexion. The hemorrhoids were small.                           The terminal ileum appeared normal.                           The exam was otherwise without abnormality on                            direct and retroflexion views. Complications:            No immediate complications. Estimated Blood Loss:     Estimated blood loss: none. Impression:               - One 6 mm polyp in the proximal transverse colon,                            removed with a cold snare. Resected and retrieved.                           - Mild pancolonic diverticulosis predominantly in                            the right colon.                           - Otherwise normal colonoscopy to TI. Recommendation:           - Patient has a contact number available for                            emergencies. The signs and symptoms of potential                            delayed complications were  discussed with the                            patient. Return to normal activities tomorrow.                            Written discharge instructions were provided to the                            patient.                           - Resume previous diet.                           - Continue present medications.                           - Await pathology results.                           - Repeat colonoscopy for surveillance based on  pathology results.                           - The findings and recommendations were discussed                            with the patient's family. Jackquline Denmark, MD 02/11/2020 9:26:23 AM This report has been signed electronically.

## 2020-02-11 NOTE — Progress Notes (Signed)
Report given to PACU, vss 

## 2020-02-11 NOTE — Progress Notes (Signed)
Called to room to assist during endoscopic procedure.  Patient ID and intended procedure confirmed with present staff. Received instructions for my participation in the procedure from the performing physician.  

## 2020-02-11 NOTE — Progress Notes (Signed)
0825 Robinul 0.1 mg IV given due large amount of secretions upon assessment.  MD made aware, vss

## 2020-02-11 NOTE — Progress Notes (Signed)
Pt's states no medical or surgical changes since previsit or office visit. 

## 2020-02-15 ENCOUNTER — Telehealth: Payer: Self-pay | Admitting: Pulmonary Disease

## 2020-02-15 ENCOUNTER — Telehealth: Payer: Self-pay | Admitting: *Deleted

## 2020-02-15 DIAGNOSIS — R053 Chronic cough: Secondary | ICD-10-CM

## 2020-02-15 DIAGNOSIS — R0683 Snoring: Secondary | ICD-10-CM

## 2020-02-15 NOTE — Telephone Encounter (Signed)
  Follow up Call-  Call back number 02/11/2020  Post procedure Call Back phone  # [phone]  Permission to leave phone message Yes  Some recent data might be hidden     Patient questions:  Do you have a fever, pain , or abdominal swelling? No. Pain Score  0 *  Have you tolerated food without any problems? Yes.    Have you been able to return to your normal activities? Yes.    Do you have any questions about your discharge instructions: Diet   No. Medications  No. Follow up visit  No.  Do you have questions or concerns about your Care? No.  Actions: * If pain score is 4 or above: No action needed, pain <4  1. Have you developed a fever since your procedure? NO  2.   Have you had an respiratory symptoms (SOB or cough) since your procedure? NO  3.   Have you tested positive for COVID 19 since your procedure NO  4.   Have you had any family members/close contacts diagnosed with the COVID 19 since your procedure?  NO   If yes to any of these questions please route to Joylene John, RN and Joella Prince, RN

## 2020-02-15 NOTE — Telephone Encounter (Signed)
Patient called complaining of increased reflux symptoms. -underwent EGD/colonoscopy with GI , apneas and choking episodes noted. Asked to contact us, he is sleeping with head of bed elevated and symptoms are somewhat improved.  We reviewed prior HST which showed moderate OSA, severe in supine position. I had offered CPAP therapy then but he deferred and wanted to repeat test but never contacted [place].  Now he is agreeable to starting CPAP therapy. Please send in prescription for auto CPAP 5 to 15 cm with nasal mask AirFit N20 to DME I explained to him about delays in CPAP equipment Also arrange for CPAP titration study in the meantime given his anxiety about starting CPAP  Cc DR Lyndel Safe

## 2020-02-15 NOTE — Telephone Encounter (Signed)
Dr Elsworth Soho, Thanks a lot. I was going to get in touch with you regarding sleep apnea. We also talked about getting pulmonary function tests-if he continues to have cough. May have some degree of asthma/hypersensitivity  Merrie Roof

## 2020-02-15 NOTE — Telephone Encounter (Signed)
Okay to schedule spirometry pre and post for chronic cough

## 2020-02-17 ENCOUNTER — Encounter: Payer: Self-pay | Admitting: Gastroenterology

## 2020-02-17 NOTE — Telephone Encounter (Signed)
Called and left message on voicemail to please return phone call to go over Dr Elsworth Soho recommendations. Contact number provided.

## 2020-02-19 NOTE — Telephone Encounter (Signed)
Patient returned phone call and writer went over Dr Bari Mantis recommendations/results and patient agreeable to placing CPAP, Cpap titration study and to spirometry pre and post being ordered. Orders placed per Dr Elsworth Soho. Patient made aware that COVID PCR needs to be done 3 days prior to pre/post spirometry which was scheduled for Friday 03/04/2020 at 1pm. Patient declining COVID testing to be done/sheduled thru [place] and insistent to have testing done at [place] and stated he will schedule it to be done on 03/01/2020. Patient advised that he needs to have the results with him prior to going thru spirometry. Patient expressed full understanding.  Nothing further needed at this time.

## 2020-02-19 NOTE — Addendum Note (Signed)
Addended by: Merrilee Seashore on: 02/19/2020 04:01 PM   Modules accepted: Orders

## 2020-02-19 NOTE — Telephone Encounter (Signed)
Called and left message on voicemail to please return phone call. Contact number provided. 

## 2020-02-24 ENCOUNTER — Encounter: Payer: Self-pay | Admitting: Internal Medicine

## 2020-02-26 ENCOUNTER — Other Ambulatory Visit: Payer: Self-pay | Admitting: Internal Medicine

## 2020-02-27 ENCOUNTER — Other Ambulatory Visit: Payer: Self-pay | Admitting: Internal Medicine

## 2020-03-04 ENCOUNTER — Ambulatory Visit (INDEPENDENT_AMBULATORY_CARE_PROVIDER_SITE_OTHER): Payer: 59 | Admitting: Pulmonary Disease

## 2020-03-04 ENCOUNTER — Other Ambulatory Visit: Payer: Self-pay

## 2020-03-04 DIAGNOSIS — R053 Chronic cough: Secondary | ICD-10-CM

## 2020-03-04 LAB — PULMONARY FUNCTION TEST
FEF 25-75 Post: 4.3 L/sec
FEF 25-75 Pre: 2.7 L/sec
FEF2575-%Change-Post: 58 %
FEF2575-%Pred-Post: 147 %
FEF2575-%Pred-Pre: 93 %
FEV1-%Change-Post: 10 %
FEV1-%Pred-Post: 84 %
FEV1-%Pred-Pre: 75 %
FEV1-Post: 2.91 L
FEV1-Pre: 2.62 L
FEV1FVC-%Change-Post: 8 %
FEV1FVC-%Pred-Pre: 105 %
FEV6-%Change-Post: 2 %
FEV6-Post: 3.24 L
FEV6-Pre: 3.17 L
FVC-%Change-Post: 2 %
FVC-%Pred-Post: 73 %
FVC-%Pred-Pre: 71 %
FVC-Post: 3.24 L
FVC-Pre: 3.17 L
Post FEV1/FVC ratio: 90 %
Post FEV6/FVC ratio: 100 %
Pre FEV1/FVC ratio: 83 %
Pre FEV6/FVC Ratio: 100 %

## 2020-03-04 NOTE — Progress Notes (Signed)
Spirometry pre and post done today. 

## 2020-04-08 ENCOUNTER — Encounter (HOSPITAL_BASED_OUTPATIENT_CLINIC_OR_DEPARTMENT_OTHER): Payer: 59 | Admitting: Pulmonary Disease

## 2020-04-09 ENCOUNTER — Encounter (HOSPITAL_COMMUNITY): Payer: Self-pay | Admitting: Emergency Medicine

## 2020-04-09 ENCOUNTER — Ambulatory Visit (HOSPITAL_COMMUNITY)
Admission: EM | Admit: 2020-04-09 | Discharge: 2020-04-09 | Disposition: A | Discharge status: Home / Self Care | Payer: 59 | Attending: Student | Admitting: Student

## 2020-04-09 ENCOUNTER — Other Ambulatory Visit: Payer: Self-pay

## 2020-04-09 DIAGNOSIS — J301 Allergic rhinitis due to pollen: Secondary | ICD-10-CM

## 2020-04-09 DIAGNOSIS — N3001 Acute cystitis with hematuria: Secondary | ICD-10-CM | POA: Insufficient documentation

## 2020-04-09 DIAGNOSIS — J45990 Exercise induced bronchospasm: Secondary | ICD-10-CM | POA: Diagnosis present

## 2020-04-09 DIAGNOSIS — K219 Gastro-esophageal reflux disease without esophagitis: Secondary | ICD-10-CM | POA: Diagnosis present

## 2020-04-09 LAB — POCT URINALYSIS DIPSTICK, ED / UC
Bilirubin Urine: NEGATIVE
Glucose, UA: NEGATIVE mg/dL
Ketones, ur: NEGATIVE mg/dL
Leukocytes,Ua: NEGATIVE
Nitrite: NEGATIVE
Protein, ur: NEGATIVE mg/dL
Specific Gravity, Urine: 1.01 (ref 1.005–1.030)
Urobilinogen, UA: 0.2 mg/dL (ref 0.0–1.0)
pH: 6 (ref 5.0–8.0)

## 2020-04-09 MED ORDER — CEPHALEXIN 500 MG PO CAPS: 500.0000 mg | ORAL_CAPSULE | Freq: Four times a day (QID) | ORAL | 0 refills | Status: DC

## 2020-04-09 MED ORDER — CETIRIZINE HCL 10 MG PO TABS: 10.0000 mg | ORAL_TABLET | Freq: Every day | ORAL | 1 refills | Status: DC

## 2020-04-09 NOTE — ED Provider Notes (Addendum)
[place]    CSN: [id number] Arrival date & time: 04/09/20  1023      History   Chief Complaint Chief Complaint  Patient presents with  . Dysuria    HPI Andrew Rowland is a 60 y.o. male presenting with dysuria. History GERD, hypertension, insomnia. Notes 5 days of dysuria and frequent urination.  -Notes 5 days of dysuria and frequent urination.  Subjective chills.  Has been taking ibuprofen occasionally, last dose was over 12 hours ago. Denies hematuria,  urgency, back pain, n/v/d/abd pain, fevers, abdnormal penile discharge. Denies history UTI in the past.  -States that he is being treated for GERD by his primary care, and this is currently well controlled on his Dexilant and Pepcid.  Denies changes in this, denies chest pain, shortness of breath, dizziness, substernal burning. -States he has had a cough for 2 days, describes this as dry but frequent.  History of exercise-induced asthma, has been using several inhaler for this with improvement.  Denies absolutely any other URI symptoms.  Endorses history of seasonal allergic rhinitis for which he is taking nothing. Denies fevers/chills, n/v/d, shortness of breath, chest pain,  congestion, facial pain, teeth pain, headaches, sore throat, loss of taste/smell, swollen lymph nodes, ear pain.     HPI  Past Medical History:  Diagnosis Date  . GERD (gastroesophageal reflux disease)   . Hypertension   . Insomnia   . Vitamin B12 deficiency   . Vitamin D deficiency     Patient Active Problem List   Diagnosis Date Noted  . GERD (gastroesophageal reflux disease) 02/02/2020  . Trigger thumb 02/02/2020  . Edema 08/27/2019  . Elevated PSA 01/06/2018  . Cough 01/02/2018  . B12 deficiency 07/16/2014  . Vitamin D deficiency 07/16/2014  . BPH (benign prostatic hyperplasia) 07/16/2014  . Exercise-induced asthma 05/11/2013  . Asthmatic bronchitis 01/22/2013  . Left otitis media 01/22/2013  . Well adult exam 05/09/2012  .  Venous insufficiency 05/02/2011  . Right ankle swelling 04/04/2011  . Headache(784.0) 08/09/2010  . LBP radiating to left leg 07/24/2010  . Essential hypertension 01/19/2009  . TOBACCO USE, QUIT 01/19/2009  . INSOMNIA 06/21/2007    Past Surgical History:  Procedure Laterality Date  . COLONOSCOPY  06/13/2012   Mild sigmoid diverticulosis. Small internal hemorrhoids. Otherwise normal colonoscopy to terminal ileum       Home Medications    Prior to Admission medications   Medication Sig Start Date End Date Taking? Authorizing Provider  amLODipine (NORVASC) 5 MG tablet TAKE 1 TABLET BY MOUTH EVERY DAY 02/26/20  Yes Plotnikov, Evie Lacks, MD  aspirin 81 MG tablet Take 81 mg by mouth daily.   Yes [provider]  cephALEXin (KEFLEX) 500 MG capsule Take 1 capsule (500 mg total) by mouth 4 (four) times daily. 04/09/20  Yes Hazel Sams, PA-C  cetirizine (ZYRTEC ALLERGY) 10 MG tablet Take 1 tablet (10 mg total) by mouth daily. 04/09/20  Yes Hazel Sams, PA-C  Cholecalciferol (VITAMIN D3) 50 MCG (2000 UT) capsule Take 1 capsule (2,000 Units total) by mouth daily. 01/10/19  Yes Plotnikov, Evie Lacks, MD  [facility] VITAMIN B12 1000 MCG tablet TAKE 1 TABLET BY MOUTH EVERY DAY 02/29/20  Yes Plotnikov, Evie Lacks, MD  dexlansoprazole (DEXILANT) 60 MG capsule Take 1 capsule (60 mg total) by mouth daily. Patient taking differently: Take 60 mg by mouth as directed. Takes 4 times a week 02/02/20  Yes Plotnikov, Evie Lacks, MD  diclofenac Sodium (VOLTAREN) 1 %  GEL Apply 1 application topically 4 (four) times daily. 02/02/20  Yes Plotnikov, Evie Lacks, MD  famotidine (PEPCID) 40 MG tablet Take 1 tablet (40 mg total) by mouth daily. 02/02/20  Yes Plotnikov, Evie Lacks, MD  fluticasone furoate-vilanterol (BREO ELLIPTA) 100-25 MCG/INH AEPB Inhale 1 puff into the lungs daily. Patient not taking: No sig reported 02/02/20   Plotnikov, Evie Lacks, MD    Family History Family History  Problem Relation Age of  Onset  . Hypertension Mother   . Hypertension Father   . Hypertension Other   . Colon cancer Neg Hx     Social History Social History   Tobacco Use  . Smoking status: Former Research scientist (life sciences)  . Smokeless tobacco: Former Network engineer  . Vaping Use: Never used  Substance Use Topics  . Alcohol use: Yes    Comment: OCC  . Drug use: No     Allergies   Lisinopril   Review of Systems Review of Systems  Constitutional: Negative for appetite change, chills, diaphoresis and fever.  HENT: Negative for congestion, ear pain, rhinorrhea, sinus pressure, sinus pain and sore throat.   Eyes: Negative for redness and visual disturbance.  Respiratory: Positive for cough. Negative for chest tightness, shortness of breath and wheezing.   Cardiovascular: Negative for chest pain and palpitations.  Gastrointestinal: Negative for abdominal pain, blood in stool, constipation, diarrhea, nausea and vomiting.  Genitourinary: Positive for dysuria and frequency. Negative for decreased urine volume, difficulty urinating, enuresis, flank pain, genital sores, hematuria, penile discharge, penile pain, penile swelling, scrotal swelling, testicular pain and urgency.  Musculoskeletal: Negative for back pain and myalgias.  Neurological: Negative for dizziness, weakness, light-headedness and headaches.  Psychiatric/Behavioral: Negative for confusion.  All other systems reviewed and are negative.    Physical Exam Triage Vital Signs ED Triage Vitals  Enc Vitals Group     BP 04/09/20 1037 (!) 148/97     Pulse Rate 04/09/20 1037 88     Resp 04/09/20 1037 18     Temp 04/09/20 1037 98.3 F (36.8 C)     Temp Source 04/09/20 1037 Oral     SpO2 04/09/20 1037 97 %     Weight --      Height --      Head Circumference --      Peak Flow --      Pain Score 04/09/20 1033 2     Pain Loc --      Pain Edu? --      Excl. in [place]? --    No data found.  Updated Vital Signs BP (!) 148/97 (BP Location: Right Arm)   Pulse  88   Temp 98.3 F (36.8 C) (Oral)   Resp 18   SpO2 97%   Visual Acuity Right Eye Distance:   Left Eye Distance:   Bilateral Distance:    Right Eye Near:   Left Eye Near:    Bilateral Near:     Physical Exam Vitals reviewed.  Constitutional:      General: He is not in acute distress.    Appearance: Normal appearance. He is not ill-appearing.  HENT:     Head: Normocephalic and atraumatic.     Right Ear: Hearing, tympanic membrane, ear canal and external ear normal. No swelling or tenderness. There is no impacted cerumen. No mastoid tenderness. Tympanic membrane is not perforated, erythematous, retracted or bulging.     Left Ear: Hearing, tympanic membrane, ear canal and external ear normal. No swelling  or tenderness. There is no impacted cerumen. No mastoid tenderness. Tympanic membrane is not perforated, erythematous, retracted or bulging.     Nose:     Right Sinus: No maxillary sinus tenderness or frontal sinus tenderness.     Left Sinus: No maxillary sinus tenderness or frontal sinus tenderness.     Mouth/Throat:     Mouth: Mucous membranes are moist.     Pharynx: Uvula midline. No oropharyngeal exudate or posterior oropharyngeal erythema.     Tonsils: No tonsillar exudate.     Comments: Moist mucous membranes Eyes:     Extraocular Movements: Extraocular movements intact.     Pupils: Pupils are equal, round, and reactive to light.  Cardiovascular:     Rate and Rhythm: Normal rate and regular rhythm.     Heart sounds: Normal heart sounds.  Pulmonary:     Effort: Pulmonary effort is normal.     Breath sounds: Normal breath sounds and air entry. No wheezing, rhonchi or rales.  Chest:     Chest wall: No tenderness.  Abdominal:     General: Abdomen is flat. Bowel sounds are normal. There is no distension.     Palpations: Abdomen is soft. There is no mass.     Tenderness: There is no abdominal tenderness. There is no right CVA tenderness, left CVA tenderness, guarding or  rebound.  Lymphadenopathy:     Cervical: No cervical adenopathy.  Skin:    General: Skin is warm.     Capillary Refill: Capillary refill takes less than 2 seconds.     Comments: Good skin turgor  Neurological:     General: No focal deficit present.     Mental Status: He is alert and oriented to person, place, and time.  Psychiatric:        Attention and Perception: Attention and perception normal.        Mood and Affect: Mood and affect normal.        Behavior: Behavior normal. Behavior is cooperative.        Thought Content: Thought content normal.        Judgment: Judgment normal.      UC Treatments / Results  Labs (all labs ordered are listed, but only abnormal results are displayed) Labs Reviewed  POCT URINALYSIS DIPSTICK, ED / UC - Abnormal; Notable for the following components:      Result Value   Hgb urine dipstick TRACE (*)    All other components within normal limits  URINE CULTURE    EKG   Radiology No results found.  Procedures Procedures (including critical care time)  Medications Ordered in UC Medications - No data to display  Initial Impression / Assessment and Plan / UC Course  I have reviewed the triage vital signs and the nursing notes.  Pertinent labs & imaging results that were available during my care of the patient were reviewed by me and considered in my medical decision making (see chart for details).     This patient is a 60 year old male presenting with acute cystitis, GERD, allergic rhinitis, exercise-induced asthma. Closely followed by PCP for these conditions. Today this pt is afebrile nontachycardic nontachypneic, oxygenating well on room air, no wheezes rhonchi or rales. Last dose of antipyretic was >12 hours ago. No CVAT.  UA with trace blood. Culture sent. Plan to treat for acute cystitis with Keflex. Low concern for prostatitis given lack of fevers, back pain. No CVAT, low suspicion for pyelo. Trial of Zyrtec for allergic rhinitis  with cough. Continue albuterol inhaler. Declines COVID screening. Continue current regimen for GERD. Return precautions discussed. Patient verbalizes understanding and agreement.  This chart was dictated using voice recognition software, Dragon. Despite the best efforts of this provider to proofread and correct errors, errors may still occur which can change documentation meaning.  Final Clinical Impressions(s) / UC Diagnoses   Final diagnoses:  Acute cystitis with hematuria  Gastroesophageal reflux disease without esophagitis  Exercise-induced asthma  Seasonal allergic rhinitis due to pollen     Discharge Instructions     -For your urinary tract infection, I sent a prescription for an antibiotic-Keflex (Cephalexin), 4 pills a day for 5 days.  We are sending a urine culture to make sure we are treating for the right bacteria. -For your seasonal allergies with cough, I sent Zyrtec.  Try 1 pill a day for at least a week, continue this if it is helping.  Also continue the albuterol inhaler as needed -Continue your Dexilant and Pepcid for GERD. -Seek immediate medical attention if you develop left-sided chest pain, dizziness, shortness of breath, new fever/chills that don't reduce with tylenol, new back pain, worsening of urinary symptoms despite treatment.    ED Prescriptions    Medication Sig Dispense Auth. Provider   cephALEXin (KEFLEX) 500 MG capsule Take 1 capsule (500 mg total) by mouth 4 (four) times daily. 20 capsule Hazel Sams, PA-C   cetirizine (ZYRTEC ALLERGY) 10 MG tablet Take 1 tablet (10 mg total) by mouth daily. 30 tablet Hazel Sams, PA-C     PDMP not reviewed this encounter.   Hazel Sams, PA-C 04/09/20 1118    Hazel Sams, PA-C 04/09/20 1118

## 2020-04-09 NOTE — Discharge Instructions (Addendum)
-  For your urinary tract infection, I sent a prescription for an antibiotic-Keflex (Cephalexin), 4 pills a day for 5 days.  We are sending a urine culture to make sure we are treating for the right bacteria. -For your seasonal allergies with cough, I sent Zyrtec.  Try 1 pill a day for at least a week, continue this if it is helping.  Also continue the albuterol inhaler as needed -Continue your Dexilant and Pepcid for GERD. -Seek immediate medical attention if you develop left-sided chest pain, dizziness, shortness of breath, new fever/chills that don't reduce with tylenol, new back pain, worsening of urinary symptoms despite treatment.

## 2020-04-09 NOTE — ED Triage Notes (Signed)
Pt presents today with painful and frequent urination x 4-5 days.

## 2020-04-11 ENCOUNTER — Telehealth (HOSPITAL_COMMUNITY): Payer: Self-pay | Admitting: Emergency Medicine

## 2020-04-11 LAB — URINE CULTURE: Culture: 30000 — AB

## 2020-04-11 MED ORDER — NITROFURANTOIN MONOHYD MACRO 100 MG PO CAPS: 100.0000 mg | ORAL_CAPSULE | Freq: Two times a day (BID) | ORAL | 0 refills | Status: DC

## 2020-04-28 ENCOUNTER — Other Ambulatory Visit: Payer: Self-pay

## 2020-04-28 ENCOUNTER — Encounter: Payer: Self-pay | Admitting: Internal Medicine

## 2020-04-28 ENCOUNTER — Ambulatory Visit (INDEPENDENT_AMBULATORY_CARE_PROVIDER_SITE_OTHER): Payer: 59 | Admitting: Internal Medicine

## 2020-04-28 DIAGNOSIS — N3000 Acute cystitis without hematuria: Secondary | ICD-10-CM

## 2020-04-28 DIAGNOSIS — N390 Urinary tract infection, site not specified: Secondary | ICD-10-CM | POA: Insufficient documentation

## 2020-04-28 LAB — URINALYSIS, ROUTINE W REFLEX MICROSCOPIC
Bilirubin Urine: NEGATIVE
Nitrite: NEGATIVE
Specific Gravity, Urine: 1.02 (ref 1.000–1.030)
Total Protein, Urine: NEGATIVE
Urine Glucose: NEGATIVE
Urobilinogen, UA: 0.2 (ref 0.0–1.0)
pH: 6 (ref 5.0–8.0)

## 2020-04-28 NOTE — Assessment & Plan Note (Signed)
?  E. faecalis UTI treated w/Keflex and Macrobid; no sx's now Renal US Repeat UA/Cx

## 2020-04-28 NOTE — Progress Notes (Signed)
Subjective:  Patient ID: Andrew Rowland, male    DOB: 03/25/1960  Age: 60 y.o. MRN: [id number]  CC: Follow-up (F/U ER- Dx UTI)   HPI Andrew Rowland presents for post  UC visit (3/26):?E. faecalis UTI treated w/Keflex and Macrobid; no sx's now  No h/o UTIs, prostatitis in the past  Outpatient Medications Prior to Visit  Medication Sig Dispense Refill  . amLODipine (NORVASC) 5 MG tablet TAKE 1 TABLET BY MOUTH EVERY DAY 90 tablet 3  . aspirin 81 MG tablet Take 81 mg by mouth daily.    . Cholecalciferol (VITAMIN D3) 50 MCG (2000 UT) capsule Take 1 capsule (2,000 Units total) by mouth daily. 100 capsule 3  . [facility] VITAMIN B12 1000 MCG tablet TAKE 1 TABLET BY MOUTH EVERY DAY 90 tablet 3  . dexlansoprazole (DEXILANT) 60 MG capsule Take 1 capsule (60 mg total) by mouth daily. (Patient taking differently: Take 60 mg by mouth as directed. Takes 4 times a week) 90 capsule 3  . diclofenac Sodium (VOLTAREN) 1 % GEL Apply 1 application topically 4 (four) times daily. 100 g 3  . famotidine (PEPCID) 40 MG tablet Take 1 tablet (40 mg total) by mouth daily. 90 tablet 3  . fluticasone furoate-vilanterol (BREO ELLIPTA) 100-25 MCG/INH AEPB Inhale 1 puff into the lungs daily. 1 each 11  . cephALEXin (KEFLEX) 500 MG capsule Take 1 capsule (500 mg total) by mouth 4 (four) times daily. (Patient not taking: Reported on 04/28/2020) 20 capsule 0  . cetirizine (ZYRTEC ALLERGY) 10 MG tablet Take 1 tablet (10 mg total) by mouth daily. 30 tablet 1  . nitrofurantoin, macrocrystal-monohydrate, (MACROBID) 100 MG capsule Take 1 capsule (100 mg total) by mouth 2 (two) times daily. (Patient not taking: Reported on 04/28/2020) 10 capsule 0  . 0.9 %  sodium chloride infusion      No facility-administered medications prior to visit.    ROS: Review of Systems  Constitutional: Negative for appetite change, fatigue and unexpected weight change.  HENT: Negative for congestion, nosebleeds, sneezing, sore throat and trouble  swallowing.   Eyes: Negative for itching and visual disturbance.  Respiratory: Negative for cough.   Cardiovascular: Negative for chest pain, palpitations and leg swelling.  Gastrointestinal: Negative for abdominal distention, blood in stool, diarrhea and nausea.  Genitourinary: Negative for frequency and hematuria.  Musculoskeletal: Negative for back pain, gait problem, joint swelling and neck pain.  Skin: Negative for rash.  Neurological: Negative for dizziness, tremors, speech difficulty and weakness.  Psychiatric/Behavioral: Negative for agitation, dysphoric mood and sleep disturbance. The patient is not nervous/anxious.     Objective:  BP 130/84 (BP Location: Left Arm)   Pulse 87   Temp 98.5 F (36.9 C) (Oral)   Ht 6' (1.829 m)   Wt 230 lb 3.2 oz (104.4 kg)   SpO2 96%   BMI 31.22 kg/m   BP Readings from Last 3 Encounters:  04/28/20 130/84  04/09/20 (!) 148/97  02/11/20 119/79    Wt Readings from Last 3 Encounters:  04/28/20 230 lb 3.2 oz (104.4 kg)  02/11/20 232 lb (105.2 kg)  02/08/20 232 lb (105.2 kg)    Physical Exam Constitutional:      General: He is not in acute distress.    Appearance: He is well-developed.     Comments: NAD  Eyes:     Conjunctiva/sclera: Conjunctivae normal.     Pupils: Pupils are equal, round, and reactive to light.  Neck:     Thyroid: No  thyromegaly.     Vascular: No JVD.  Cardiovascular:     Rate and Rhythm: Normal rate and regular rhythm.     Heart sounds: Normal heart sounds. No murmur heard. No friction rub. No gallop.   Pulmonary:     Effort: Pulmonary effort is normal. No respiratory distress.     Breath sounds: Normal breath sounds. No wheezing or rales.  Chest:     Chest wall: No tenderness.  Abdominal:     General: Bowel sounds are normal. There is no distension.     Palpations: Abdomen is soft. There is no mass.     Tenderness: There is no abdominal tenderness. There is no guarding or rebound.  Musculoskeletal:         General: No tenderness. Normal range of motion.     Cervical back: Normal range of motion.  Lymphadenopathy:     Cervical: No cervical adenopathy.  Skin:    General: Skin is warm and dry.     Findings: No rash.  Neurological:     Mental Status: He is alert and oriented to person, place, and time.     Cranial Nerves: No cranial nerve deficit.     Motor: No abnormal muscle tone.     Coordination: Coordination normal.     Gait: Gait normal.     Deep Tendon Reflexes: Reflexes are normal and symmetric.  Psychiatric:        Behavior: Behavior normal.        Thought Content: Thought content normal.        Judgment: Judgment normal.     Lab Results  Component Value Date   WBC 7.7 02/02/2020   HGB 15.6 02/02/2020   HCT 46.8 02/02/2020   PLT 258.0 02/02/2020   GLUCOSE 91 02/02/2020   CHOL 191 02/02/2020   TRIG 99.0 02/02/2020   HDL 47.20 02/02/2020   LDLCALC 124 (H) 02/02/2020   ALT 14 02/02/2020   AST 19 02/02/2020   NA 135 02/02/2020   K 4.2 02/02/2020   CL 99 02/02/2020   CREATININE 1.07 02/02/2020   BUN 12 02/02/2020   CO2 27 02/02/2020   TSH 3.22 02/02/2020   PSA 2.87 02/02/2020    No results found.  Assessment & Plan:    Follow-up: No follow-ups on file.  Walker Kehr, MD

## 2020-04-30 LAB — CULTURE, URINE COMPREHENSIVE

## 2020-05-01 ENCOUNTER — Encounter: Payer: Self-pay | Admitting: Internal Medicine

## 2020-05-01 MED ORDER — NITROFURANTOIN MONOHYD MACRO 100 MG PO CAPS: 100.0000 mg | ORAL_CAPSULE | Freq: Two times a day (BID) | ORAL | 0 refills | Status: AC

## 2020-05-17 ENCOUNTER — Ambulatory Visit
Admission: RE | Admit: 2020-05-17 | Discharge: 2020-05-17 | Disposition: A | Discharge status: Home / Self Care | Payer: 59 | Source: Ambulatory Visit | Attending: Internal Medicine | Admitting: Internal Medicine

## 2020-05-17 DIAGNOSIS — N3000 Acute cystitis without hematuria: Secondary | ICD-10-CM

## 2020-05-19 ENCOUNTER — Telehealth: Payer: Self-pay

## 2020-05-19 NOTE — Telephone Encounter (Signed)
2. Question of an isoechoic solid 2.1 cm upper right renal parenchymal mass. MRI (preferred) or CT abdomen without and with IV contrast recommended for further characterization at this time.

## 2020-05-20 ENCOUNTER — Other Ambulatory Visit: Payer: Self-pay | Admitting: Internal Medicine

## 2020-05-20 DIAGNOSIS — N2889 Other specified disorders of kidney and ureter: Secondary | ICD-10-CM

## 2020-05-20 NOTE — Telephone Encounter (Signed)
Addressed Thx 

## 2020-05-23 ENCOUNTER — Ambulatory Visit
Admission: RE | Admit: 2020-05-23 | Discharge: 2020-05-23 | Disposition: A | Discharge status: Home / Self Care | Payer: 59 | Source: Ambulatory Visit | Attending: Internal Medicine | Admitting: Internal Medicine

## 2020-05-23 ENCOUNTER — Other Ambulatory Visit: Payer: Self-pay

## 2020-05-23 DIAGNOSIS — N2889 Other specified disorders of kidney and ureter: Secondary | ICD-10-CM

## 2020-05-23 MED ORDER — GADOBENATE DIMEGLUMINE 529 MG/ML IV SOLN
20.0000 mL | Freq: Once | INTRAVENOUS | Status: AC | PRN
  Administered 2020-05-23: 20 mL via INTRAVENOUS

## 2020-05-25 ENCOUNTER — Other Ambulatory Visit: Payer: Self-pay | Admitting: Internal Medicine

## 2020-05-25 MED ORDER — FINASTERIDE 5 MG PO TABS: 5.0000 mg | ORAL_TABLET | Freq: Every day | ORAL | 3 refills | Status: DC

## 2020-06-09 ENCOUNTER — Encounter (HOSPITAL_BASED_OUTPATIENT_CLINIC_OR_DEPARTMENT_OTHER): Payer: Self-pay

## 2020-06-10 ENCOUNTER — Ambulatory Visit (HOSPITAL_BASED_OUTPATIENT_CLINIC_OR_DEPARTMENT_OTHER): Payer: 59 | Attending: Pulmonary Disease | Admitting: Pulmonary Disease

## 2020-06-10 ENCOUNTER — Other Ambulatory Visit: Payer: Self-pay

## 2020-06-10 DIAGNOSIS — G4733 Obstructive sleep apnea (adult) (pediatric): Secondary | ICD-10-CM

## 2020-06-10 DIAGNOSIS — R0683 Snoring: Secondary | ICD-10-CM | POA: Diagnosis present

## 2020-06-14 ENCOUNTER — Other Ambulatory Visit: Payer: Self-pay

## 2020-06-14 ENCOUNTER — Other Ambulatory Visit (HOSPITAL_BASED_OUTPATIENT_CLINIC_OR_DEPARTMENT_OTHER): Payer: Self-pay

## 2020-06-14 DIAGNOSIS — R0683 Snoring: Secondary | ICD-10-CM

## 2020-06-17 DIAGNOSIS — G4733 Obstructive sleep apnea (adult) (pediatric): Secondary | ICD-10-CM

## 2020-06-17 NOTE — Progress Notes (Signed)
Patient Name: Andrew Rowland, Andrew Rowland Date: 06/10/2020 Gender: Male D.O.B: 07-14-1960 Age (years): 5 Referring Provider: Kara Mead MD, ABSM Height (inches): 72 Interpreting Physician: Kara Mead MD, ABSM Weight (lbs): 228 RPSGT: Zadie Rhine BMI: 31 MRN: [id number] Neck Size: 17.00 <br> <br> CLINICAL INFORMATION The patient is referred for a CPAP titration to treat sleep apnea.    Date of HST: 03/2019 showed moderate OSA 26/hour, severe in the supine position 47/hour  SLEEP STUDY TECHNIQUE As per the AASM Manual for the Scoring of Sleep and Associated Events v2.3 (April 2016) with a hypopnea requiring 4% desaturations.  The channels recorded and monitored were frontal, central and occipital EEG, electrooculogram (EOG), submentalis EMG (chin), nasal and oral airflow, thoracic and abdominal wall motion, anterior tibialis EMG, snore microphone, electrocardiogram, and pulse oximetry. Continuous positive airway pressure (CPAP) was initiated at the beginning of the study and titrated to treat sleep-disordered breathing.  MEDICATIONS Medications self-administered by patient taken the night of the study : N/A  TECHNICIAN COMMENTS Comments added by technician: PT HAD ONE BATHROOM VISTED Comments added by scorer: N/A RESPIRATORY PARAMETERS Optimal PAP Pressure (cm): 12 AHI at Optimal Pressure (/hr): 0 Overall Minimal O2 (%): 84.0 Supine % at Optimal Pressure (%): 100 Minimal O2 at Optimal Pressure (%): 86.0   SLEEP ARCHITECTURE The study was initiated at 10:01:27 PM and ended at 4:43:24 AM.  Sleep onset time was 7.9 minutes and the sleep efficiency was 83.5%%. The total sleep time was 335.5 minutes.  The patient spent 4.8%% of the night in stage N1 sleep, 58.7%% in stage N2 sleep, 10.1%% in stage N3 and 26.4% in REM.Stage REM latency was 54.5 minutes  Wake after sleep onset was 58.5. Alpha intrusion was absent. Supine sleep was 33.83%.  CARDIAC DATA The 2 lead EKG demonstrated  sinus rhythm. The mean heart rate was 62.0 beats per minute. Other EKG findings include: None.   LEG MOVEMENT DATA The total Periodic Limb Movements of Sleep (PLMS) were 0. The PLMS index was 0.0. A PLMS index of <15 is considered normal in adults.  IMPRESSIONS - The optimal PAP pressure was 12 cm of water. - Central sleep apnea was not noted during this titration (CAI = 0.5/h). - Moderate oxygen desaturations were observed during this titration (min O2 = 84.0%). - No snoring was audible during this study. - No cardiac abnormalities were observed during this study. - Clinically significant periodic limb movements were not noted during this study. Arousals associated with PLMs were rare.   DIAGNOSIS - Obstructive Sleep Apnea (G47.33)   RECOMMENDATIONS - Trial of CPAP therapy on 12 cm H2O with a Large size Fisher[employer]Paykel Full Face Mask Simplus mask and heated humidification. - Avoid alcohol, sedatives and other CNS depressants that may worsen sleep apnea and disrupt normal sleep architecture. - Sleep hygiene should be reviewed to assess factors that may improve sleep quality. - Weight management and regular exercise should be initiated or continued. - Return to [facility] for re-evaluation after 4 weeks of therapy   Kara Mead MD Board Certified in [place]

## 2020-06-20 ENCOUNTER — Telehealth: Payer: Self-pay | Admitting: Pulmonary Disease

## 2020-06-20 DIAGNOSIS — G4733 Obstructive sleep apnea (adult) (pediatric): Secondary | ICD-10-CM

## 2020-06-20 NOTE — Telephone Encounter (Signed)
Patient is returning phone call. Patient phone number is [phone].

## 2020-06-20 NOTE — Telephone Encounter (Signed)
Called and went over Dr Bari Mantis note/result of CPAP titration study and recommendations with patient. All questions answered and patient expressed full understanding. Order placed per Dr Elsworth Soho. Patient expressed full understanding to call and schedule follow up 1 month after using cpap. Nothing further needed at this time.

## 2020-06-20 NOTE — Telephone Encounter (Signed)
I tried reaching him about the results of his CPAP titration study. That showed CPAP requirement of 12 cm Please ensure that prescription is sent for CPAP 12 cm with full facemask, please inform them about the delay in obtaining CPAP's.  [This prescription may have been sent earlier]. Please arrange follow-up office visit 1 month after using CPAP

## 2020-07-13 DIAGNOSIS — G4733 Obstructive sleep apnea (adult) (pediatric): Secondary | ICD-10-CM | POA: Diagnosis not present

## 2020-07-13 NOTE — Procedures (Signed)
Patient Name: Andrew Rowland, Andrew Rowland Date: 06/10/2020 Gender: Male D.O.B: 1960-07-05 Age (years): 40 Referring Provider: Kara Mead MD, ABSM Height (inches): 72 Interpreting Physician: Kara Mead MD, ABSM Weight (lbs): 228 RPSGT: Zadie Rhine BMI: 31 MRN: [id number] Neck Size: 17.00 <br> <br> CLINICAL INFORMATION The patient is referred for a CPAP titration to treat sleep apnea.       Date of HST: 03/2019 showed moderate OSA 26/hour, severe in the supine position 47/hour   SLEEP STUDY TECHNIQUE As per the AASM Manual for the Scoring of Sleep and Associated Events v2.3 (April 2016) with a hypopnea requiring 4% desaturations.   The channels recorded and monitored were frontal, central and occipital EEG, electrooculogram (EOG), submentalis EMG (chin), nasal and oral airflow, thoracic and abdominal wall motion, anterior tibialis EMG, snore microphone, electrocardiogram, and pulse oximetry. Continuous positive airway pressure (CPAP) was initiated at the beginning of the study and titrated to treat sleep-disordered breathing.   MEDICATIONS Medications self-administered by patient taken the night of the study : N/A   TECHNICIAN COMMENTS Comments added by technician: PT HAD ONE BATHROOM VISTED Comments added by scorer: N/A RESPIRATORY PARAMETERS Optimal PAP Pressure (cm):  12        AHI at Optimal Pressure (/hr):            0 Overall Minimal O2 (%):         84.0     Supine % at Optimal Pressure (%):    100 Minimal O2 at Optimal Pressure (%): 86.0        SLEEP ARCHITECTURE The study was initiated at 10:01:27 PM and ended at 4:43:24 AM.   Sleep onset time was 7.9 minutes and the sleep efficiency was 83.5%%. The total sleep time was 335.5 minutes.   The patient spent 4.8%% of the night in stage N1 sleep, 58.7%% in stage N2 sleep, 10.1%% in stage N3 and 26.4% in REM.Stage REM latency was 54.5 minutes   Wake after sleep onset was 58.5. Alpha intrusion was absent. Supine sleep was  33.83%.   CARDIAC DATA The 2 lead EKG demonstrated sinus rhythm. The mean heart rate was 62.0 beats per minute. Other EKG findings include: None.     LEG MOVEMENT DATA The total Periodic Limb Movements of Sleep (PLMS) were 0. The PLMS index was 0.0. A PLMS index of <15 is considered normal in adults.   IMPRESSIONS - The optimal PAP pressure was 12 cm of water. - Central sleep apnea was not noted during this titration (CAI = 0.5/h). - Moderate oxygen desaturations were observed during this titration (min O2 = 84.0%). - No snoring was audible during this study. - No cardiac abnormalities were observed during this study. - Clinically significant periodic limb movements were not noted during this study. Arousals associated with PLMs were rare.     DIAGNOSIS - Obstructive Sleep Apnea (G47.33)     RECOMMENDATIONS - Trial of CPAP therapy on 12 cm H2O with a Large size Fisher&Paykel Full Face Mask Simplus mask and heated humidification. - Avoid alcohol, sedatives and other CNS depressants that may worsen sleep apnea and disrupt normal sleep architecture. - Sleep hygiene should be reviewed to assess factors that may improve sleep quality. - Weight management and regular exercise should be initiated or continued. - Return to [facility] for re-evaluation after 4 weeks of therapy     Kara Mead MD Board Certified in [place]

## 2020-08-11 ENCOUNTER — Other Ambulatory Visit: Payer: Self-pay | Admitting: Internal Medicine

## 2020-08-11 NOTE — Telephone Encounter (Signed)
Dexilant is not covered by plan.. The alternatives are pantoprazole (PROTONIX) 40 MG tablet, lansoprazole (PREVACID) 30 MG capsule, or omeprazole (PRILOSEC) 20 MG capsule. Pls advise on which one you prefer.Marland KitchenJohny Chess

## 2020-10-31 ENCOUNTER — Other Ambulatory Visit: Payer: Self-pay | Admitting: Internal Medicine

## 2020-12-02 ENCOUNTER — Encounter: Payer: Self-pay | Admitting: Internal Medicine

## 2020-12-03 ENCOUNTER — Encounter (HOSPITAL_BASED_OUTPATIENT_CLINIC_OR_DEPARTMENT_OTHER): Payer: Self-pay | Admitting: *Deleted

## 2020-12-03 ENCOUNTER — Emergency Department (HOSPITAL_BASED_OUTPATIENT_CLINIC_OR_DEPARTMENT_OTHER): Payer: 59 | Admitting: Radiology

## 2020-12-03 ENCOUNTER — Other Ambulatory Visit: Payer: Self-pay

## 2020-12-03 ENCOUNTER — Emergency Department (HOSPITAL_BASED_OUTPATIENT_CLINIC_OR_DEPARTMENT_OTHER)
Admission: EM | Admit: 2020-12-03 | Discharge: 2020-12-03 | Disposition: A | Discharge status: Home / Self Care | Payer: 59 | Attending: Student | Admitting: Student

## 2020-12-03 ENCOUNTER — Ambulatory Visit (HOSPITAL_COMMUNITY)
Admission: EM | Admit: 2020-12-03 | Discharge: 2020-12-03 | Disposition: A | Discharge status: Home / Self Care | Payer: 59

## 2020-12-03 DIAGNOSIS — Z79899 Other long term (current) drug therapy: Secondary | ICD-10-CM | POA: Insufficient documentation

## 2020-12-03 DIAGNOSIS — J45909 Unspecified asthma, uncomplicated: Secondary | ICD-10-CM | POA: Diagnosis not present

## 2020-12-03 DIAGNOSIS — M545 Low back pain, unspecified: Secondary | ICD-10-CM | POA: Insufficient documentation

## 2020-12-03 DIAGNOSIS — I10 Essential (primary) hypertension: Secondary | ICD-10-CM | POA: Diagnosis not present

## 2020-12-03 DIAGNOSIS — Z7982 Long term (current) use of aspirin: Secondary | ICD-10-CM | POA: Insufficient documentation

## 2020-12-03 DIAGNOSIS — Z87891 Personal history of nicotine dependence: Secondary | ICD-10-CM | POA: Insufficient documentation

## 2020-12-03 MED ORDER — KETOROLAC TROMETHAMINE 15 MG/ML IJ SOLN
15.0000 mg | Freq: Once | INTRAMUSCULAR | Status: AC
  Administered 2020-12-03: 15 mg via INTRAMUSCULAR
  Filled 2020-12-03: qty 1

## 2020-12-03 MED ORDER — PREDNISONE 10 MG (21) PO TBPK: ORAL_TABLET | Freq: Every day | ORAL | 0 refills | Status: DC

## 2020-12-03 NOTE — ED Triage Notes (Addendum)
Right lower back pain radiating to his right thigh for 2 weeks.

## 2020-12-03 NOTE — Discharge Instructions (Signed)
Take prednisone as directed.   Please follow up with your primary care provider within 5-7 days for re-evaluation of your symptoms. If you do not have a primary care provider, information for a healthcare clinic has been provided for you to make arrangements for follow up care. Please return to the emergency department for any new or worsening symptoms.  

## 2020-12-03 NOTE — ED Provider Notes (Signed)
[place] EMERGENCY DEPT Provider Note   CSN: [id number] Arrival date & time: 12/03/20  1635     History Chief Complaint  Patient presents with   Back Pain    Andrew Rowland is a 60 y.o. male.  HPI  60 year old male with a history of GERD, hypertension, insomnia, vitamin B 12 and D deficiency presents to the emergency department today for evaluation of low back pain.  Patient states that he has had pain for the last 2 weeks.  He did have a fall about 2 weeks ago off of his bike.  He does also note that he has been running daily on his treadmill recently but had to stop due to the pain.  He has pain to the lower back, mainly on the right side and then pain to the bilateral lower extremities mainly to the lateral aspect along the IT band.  He has not had any numbness of the lower extremities.  Denies any incontinence, fevers, GI/GU symptoms, history of cancer or IV drug use.  He has some muscle relaxers and has been taking ibuprofen at home which has provided some temporary relief.  He is also used Management consultant which is helped his symptoms.  His pain does improve when he rests however it is worse after he ambulates.   Past Medical History:  Diagnosis Date   GERD (gastroesophageal reflux disease)    Hypertension    Insomnia    Vitamin B12 deficiency    Vitamin D deficiency     Patient Active Problem List   Diagnosis Date Noted   UTI (urinary tract infection) 04/28/2020   GERD (gastroesophageal reflux disease) 02/02/2020   Trigger thumb 02/02/2020   Edema 08/27/2019   Elevated PSA 01/06/2018   Cough 01/02/2018   B12 deficiency 07/16/2014   Vitamin D deficiency 07/16/2014   BPH (benign prostatic hyperplasia) 07/16/2014   Exercise-induced asthma 05/11/2013   Asthmatic bronchitis 01/22/2013   Left otitis media 01/22/2013   Well adult exam 05/09/2012   Venous insufficiency 05/02/2011   Right ankle swelling 04/04/2011   Headache(784.0) 08/09/2010   LBP radiating  to left leg 07/24/2010   Essential hypertension 01/19/2009   TOBACCO USE, QUIT 01/19/2009   INSOMNIA 06/21/2007    Past Surgical History:  Procedure Laterality Date   COLONOSCOPY  06/13/2012   Mild sigmoid diverticulosis. Small internal hemorrhoids. Otherwise normal colonoscopy to terminal ileum       Family History  Problem Relation Age of Onset   Hypertension Mother    Hypertension Father    Hypertension Other    Colon cancer Neg Hx     Social History   Tobacco Use   Smoking status: Former   Smokeless tobacco: Former  Scientific laboratory technician Use: Never used  Substance Use Topics   Alcohol use: Yes    Comment: OCC   Drug use: No    Home Medications Prior to Admission medications   Medication Sig Start Date End Date Taking? Authorizing Provider  amLODipine (NORVASC) 5 MG tablet TAKE 1 TABLET BY MOUTH EVERY DAY 02/26/20  Yes Plotnikov, Evie Lacks, MD  aspirin 81 MG tablet Take 81 mg by mouth daily.   Yes [provider]  Cholecalciferol (VITAMIN D3) 50 MCG (2000 UT) capsule Take 1 capsule (2,000 Units total) by mouth daily. 01/10/19  Yes Plotnikov, Evie Lacks, MD  finasteride (PROSCAR) 5 MG tablet Take 1 tablet (5 mg total) by mouth daily. 05/25/20 05/25/21 Yes Plotnikov, Evie Lacks, MD  pantoprazole ([place])  40 MG tablet Take 1 tablet (40 mg total) by mouth daily. 08/12/20  Yes Plotnikov, Evie Lacks, MD  fluticasone furoate-vilanterol (BREO ELLIPTA) 100-25 MCG/INH AEPB Inhale 1 puff into the lungs daily. 02/02/20   Plotnikov, Evie Lacks, MD  predniSONE (STERAPRED UNI-PAK 21 TAB) 10 MG (21) TBPK tablet Take by mouth daily. Take 6 tabs by mouth daily  for 2 days, then 5 tabs for 2 days, then 4 tabs for 2 days, then 3 tabs for 2 days, 2 tabs for 2 days, then 1 tab by mouth daily for 2 days 12/03/20  Yes Maryiah Olvey S, PA-C    Allergies    Lisinopril  Review of Systems   Review of Systems  Constitutional:  Negative for fever.  Respiratory:  Negative for shortness of  breath.   Cardiovascular:  Negative for chest pain.  Gastrointestinal:  Negative for abdominal pain, diarrhea, nausea and vomiting.  Genitourinary:  Negative for dysuria, flank pain, genital sores and urgency.  Musculoskeletal:  Positive for back pain.  Neurological:  Negative for weakness and numbness.   Physical Exam Updated Vital Signs BP (!) 147/96   Pulse 79   Temp 98.1 F (36.7 C) (Oral)   Resp 18   Ht 6' (1.829 m)   Wt 102.1 kg   SpO2 97%   BMI 30.52 kg/m   Physical Exam Constitutional:      General: He is not in acute distress.    Appearance: He is well-developed.  Eyes:     Conjunctiva/sclera: Conjunctivae normal.  Cardiovascular:     Rate and Rhythm: Normal rate and regular rhythm.  Pulmonary:     Effort: Pulmonary effort is normal.     Breath sounds: Normal breath sounds.  Abdominal:     Palpations: Abdomen is soft.     Tenderness: There is no abdominal tenderness.  Musculoskeletal:     Comments: No focal midline tenderness but patient does have some bilateral paraspinous lumbar muscle tenderness.  No TTP to the bilateral SI joints.  He has some minimal tenderness along the IT bands bilaterally.  5/5 strength noted to the bilateral lower extremities with normal sensation throughout.  Distal pulses intact.  Patient ambulatory with steady gait.  Skin:    General: Skin is warm and dry.  Neurological:     Mental Status: He is alert and oriented to person, place, and time.    ED Results / Procedures / Treatments   Labs (all labs ordered are listed, but only abnormal results are displayed) Labs Reviewed - No data to display  EKG None  Radiology DG Lumbar Spine Complete  Result Date: 12/03/2020 CLINICAL DATA:  Low back pain radiating into the right leg for 2 weeks, initial encounter EXAM: LUMBAR SPINE - COMPLETE 4+ VIEW COMPARISON:  07/17/2010 FINDINGS: Five lumbar type vertebral bodies are well visualized. Osteophytic changes are noted. No acute fracture is  seen. No pars defects are noted. Degenerative retrolisthesis of L3 on L4 is noted. Disc space narrowing is noted from L3-S1. No soft tissue abnormality is noted. IMPRESSION: Degenerative change without acute abnormality. Electronically Signed   By: Inez Catalina M.D.   On: 12/03/2020 19:55   DG Hip Unilat W or Wo Pelvis 2-3 Views Right  Result Date: 12/03/2020 CLINICAL DATA:  Right leg pain, initial encounter EXAM: DG HIP (WITH OR WITHOUT PELVIS) 3V RIGHT COMPARISON:  None. FINDINGS: Pelvic ring is intact. No acute fracture or dislocation is noted. No soft tissue abnormality is seen. Degenerative changes of lumbar  spine are noted. IMPRESSION: No acute abnormality noted. Electronically Signed   By: Inez Catalina M.D.   On: 12/03/2020 19:57    Procedures Procedures   Medications Ordered in ED Medications  ketorolac (TORADOL) 15 MG/ML injection 15 mg (15 mg Intramuscular Given 12/03/20 1851)    ED Course  I have reviewed the triage vital signs and the nursing notes.  Pertinent labs & imaging results that were available during my care of the patient were reviewed by me and considered in my medical decision making (see chart for details).    MDM Rules/Calculators/A&P                          Patient with back pain for 2 weeks present after a fall and increased frequency of exercising.  No neurological deficits and normal neuro exam.  Patient can walk but states is painful.  No loss of bowel or bladder control.  No concern for cauda equina.  No fever, night sweats, weight loss, h/o cancer, IVDU.  X-ray of the lumbar spine and pelvis obtained which were negative for any acute bony pathology.  Advised patient to continue ibuprofen, muscle relaxer and will also prescribe prednisone taper.  RICE protocol and pain medicine indicated and discussed with patient.   Final Clinical Impression(s) / ED Diagnoses Final diagnoses:  Acute low back pain, unspecified back pain laterality, unspecified whether  sciatica present    Rx / DC Orders ED Discharge Orders          Ordered    predniSONE (STERAPRED UNI-PAK 21 TAB) 10 MG (21) TBPK tablet  Daily        12/03/20 2019             Rodney Booze, PA-C 12/03/20 2019    Teressa Lower, MD 12/03/20 2351

## 2020-12-07 ENCOUNTER — Ambulatory Visit: Payer: 59 | Admitting: Internal Medicine

## 2020-12-10 ENCOUNTER — Encounter: Payer: Self-pay | Admitting: Pulmonary Disease

## 2020-12-11 ENCOUNTER — Emergency Department (HOSPITAL_BASED_OUTPATIENT_CLINIC_OR_DEPARTMENT_OTHER)
Admission: EM | Admit: 2020-12-11 | Discharge: 2020-12-11 | Disposition: A | Discharge status: Home / Self Care | Payer: 59 | Attending: Emergency Medicine | Admitting: Emergency Medicine

## 2020-12-11 ENCOUNTER — Other Ambulatory Visit: Payer: Self-pay

## 2020-12-11 ENCOUNTER — Encounter (HOSPITAL_BASED_OUTPATIENT_CLINIC_OR_DEPARTMENT_OTHER): Payer: Self-pay | Admitting: Emergency Medicine

## 2020-12-11 ENCOUNTER — Emergency Department (HOSPITAL_BASED_OUTPATIENT_CLINIC_OR_DEPARTMENT_OTHER): Payer: 59

## 2020-12-11 DIAGNOSIS — Z7982 Long term (current) use of aspirin: Secondary | ICD-10-CM | POA: Diagnosis not present

## 2020-12-11 DIAGNOSIS — J45909 Unspecified asthma, uncomplicated: Secondary | ICD-10-CM | POA: Diagnosis not present

## 2020-12-11 DIAGNOSIS — I10 Essential (primary) hypertension: Secondary | ICD-10-CM | POA: Insufficient documentation

## 2020-12-11 DIAGNOSIS — Z7952 Long term (current) use of systemic steroids: Secondary | ICD-10-CM | POA: Insufficient documentation

## 2020-12-11 DIAGNOSIS — M5442 Lumbago with sciatica, left side: Secondary | ICD-10-CM | POA: Insufficient documentation

## 2020-12-11 DIAGNOSIS — M5432 Sciatica, left side: Secondary | ICD-10-CM

## 2020-12-11 DIAGNOSIS — Z79899 Other long term (current) drug therapy: Secondary | ICD-10-CM | POA: Insufficient documentation

## 2020-12-11 DIAGNOSIS — Z87891 Personal history of nicotine dependence: Secondary | ICD-10-CM | POA: Diagnosis not present

## 2020-12-11 DIAGNOSIS — M545 Low back pain, unspecified: Secondary | ICD-10-CM | POA: Diagnosis present

## 2020-12-11 MED ORDER — OXYCODONE-ACETAMINOPHEN 5-325 MG PO TABS
1.0000 | ORAL_TABLET | Freq: Once | ORAL | Status: AC
  Administered 2020-12-11: 12:00:00 1 via ORAL
  Filled 2020-12-11: qty 1

## 2020-12-11 MED ORDER — METHOCARBAMOL 500 MG PO TABS
500.0000 mg | ORAL_TABLET | Freq: Once | ORAL | Status: AC
  Administered 2020-12-11: 12:00:00 500 mg via ORAL
  Filled 2020-12-11: qty 1

## 2020-12-11 MED ORDER — KETOROLAC TROMETHAMINE 30 MG/ML IJ SOLN
30.0000 mg | Freq: Once | INTRAMUSCULAR | Status: AC
  Administered 2020-12-11: 12:00:00 30 mg via INTRAMUSCULAR
  Filled 2020-12-11: qty 1

## 2020-12-11 MED ORDER — OXYCODONE-ACETAMINOPHEN 5-325 MG PO TABS: 1.0000 | ORAL_TABLET | Freq: Four times a day (QID) | ORAL | 0 refills | Status: AC | PRN

## 2020-12-11 MED ORDER — HYDROMORPHONE HCL 1 MG/ML IJ SOLN
1.0000 mg | Freq: Once | INTRAMUSCULAR | Status: AC
  Administered 2020-12-11: 13:00:00 1 mg via INTRAMUSCULAR
  Filled 2020-12-11: qty 1

## 2020-12-11 NOTE — ED Notes (Signed)
ED Provider at bedside. 

## 2020-12-11 NOTE — ED Provider Notes (Signed)
[place] EMERGENCY DEPARTMENT Provider Note   CSN: [id number] Arrival date & time: 12/11/20  1051     History Chief Complaint  Patient presents with   Back Pain    Andrew Rowland is a 60 y.o. male.  Presents to ER with concern for low back pain.  Reports pain has been ongoing for the past couple weeks.  Initially was in the low back and radiated to both of his legs.  Went to [facility] emergency room on 11/19, reports that he was prescribed steroid course that he is currently taking, almost finished.  Some of his symptoms seem to have improved but still has been having pain.  Intermittent, seems to be worse with certain positions, improved generally with lying flat.  Gets worse when he has to get up and move around.  Last night and today pain seems to be worse on the left side, left thigh region.  No associated numbness or weakness.  No bladder or bowel incontinence.  Reports that he has an appointment to see [facility] on Wednesday of this coming week.  No new trauma.  HPI     Past Medical History:  Diagnosis Date   GERD (gastroesophageal reflux disease)    Hypertension    Insomnia    Vitamin B12 deficiency    Vitamin D deficiency     Patient Active Problem List   Diagnosis Date Noted   UTI (urinary tract infection) 04/28/2020   GERD (gastroesophageal reflux disease) 02/02/2020   Trigger thumb 02/02/2020   Edema 08/27/2019   Elevated PSA 01/06/2018   Cough 01/02/2018   B12 deficiency 07/16/2014   Vitamin D deficiency 07/16/2014   BPH (benign prostatic hyperplasia) 07/16/2014   Exercise-induced asthma 05/11/2013   Asthmatic bronchitis 01/22/2013   Left otitis media 01/22/2013   Well adult exam 05/09/2012   Venous insufficiency 05/02/2011   Right ankle swelling 04/04/2011   Headache(784.0) 08/09/2010   LBP radiating to left leg 07/24/2010   Essential hypertension 01/19/2009   TOBACCO USE, QUIT 01/19/2009   INSOMNIA 06/21/2007    Past Surgical  History:  Procedure Laterality Date   COLONOSCOPY  06/13/2012   Mild sigmoid diverticulosis. Small internal hemorrhoids. Otherwise normal colonoscopy to terminal ileum       Family History  Problem Relation Age of Onset   Hypertension Mother    Hypertension Father    Hypertension Other    Colon cancer Neg Hx     Social History   Tobacco Use   Smoking status: Former   Smokeless tobacco: Former  Scientific laboratory technician Use: Never used  Substance Use Topics   Alcohol use: Yes    Comment: OCC   Drug use: No    Home Medications Prior to Admission medications   Medication Sig Start Date End Date Taking? Authorizing Provider  oxyCODONE-acetaminophen (PERCOCET/ROXICET) 5-325 MG tablet Take 1 tablet by mouth every 6 (six) hours as needed for up to 3 days for severe pain. 12/11/20 12/14/20 Yes Lael Wetherbee, Ellwood Dense, MD  amLODipine (NORVASC) 5 MG tablet TAKE 1 TABLET BY MOUTH EVERY DAY 02/26/20   Plotnikov, Evie Lacks, MD  aspirin 81 MG tablet Take 81 mg by mouth daily.    [provider]  Cholecalciferol (VITAMIN D3) 50 MCG (2000 UT) capsule Take 1 capsule (2,000 Units total) by mouth daily. 01/10/19   Plotnikov, Evie Lacks, MD  finasteride (PROSCAR) 5 MG tablet Take 1 tablet (5 mg total) by mouth daily. 05/25/20 05/25/21  Plotnikov, Evie Lacks,  MD  fluticasone furoate-vilanterol (BREO ELLIPTA) 100-25 MCG/INH AEPB Inhale 1 puff into the lungs daily. 02/02/20   Plotnikov, Evie Lacks, MD  pantoprazole (PROTONIX) 40 MG tablet Take 1 tablet (40 mg total) by mouth daily. 08/12/20   Plotnikov, Evie Lacks, MD  predniSONE (STERAPRED UNI-PAK 21 TAB) 10 MG (21) TBPK tablet Take by mouth daily. Take 6 tabs by mouth daily  for 2 days, then 5 tabs for 2 days, then 4 tabs for 2 days, then 3 tabs for 2 days, 2 tabs for 2 days, then 1 tab by mouth daily for 2 days 12/03/20   Couture, Cortni S, PA-C    Allergies    Lisinopril  Review of Systems   Review of Systems  Constitutional:  Negative for chills  and fever.  HENT:  Negative for ear pain and sore throat.   Eyes:  Negative for pain and visual disturbance.  Respiratory:  Negative for cough and shortness of breath.   Cardiovascular:  Negative for chest pain and palpitations.  Gastrointestinal:  Negative for abdominal pain and vomiting.  Genitourinary:  Negative for dysuria and hematuria.  Musculoskeletal:  Positive for arthralgias and back pain.  Skin:  Negative for color change and rash.  Neurological:  Negative for seizures and syncope.  All other systems reviewed and are negative.  Physical Exam Updated Vital Signs BP (!) 142/102   Pulse 68   Temp 98.1 F (36.7 C) (Oral)   Resp 16   SpO2 95%   Physical Exam Vitals and nursing note reviewed.  Constitutional:      General: He is not in acute distress.    Appearance: He is well-developed.  HENT:     Head: Normocephalic and atraumatic.  Eyes:     Conjunctiva/sclera: Conjunctivae normal.  Cardiovascular:     Rate and Rhythm: Normal rate.     Pulses: Normal pulses.  Pulmonary:     Effort: Pulmonary effort is normal. No respiratory distress.  Abdominal:     Palpations: Abdomen is soft.     Tenderness: There is no abdominal tenderness.  Musculoskeletal:        General: No swelling, deformity or signs of injury.     Cervical back: Neck supple.     Comments: No midline L spine TTP, some ttp in L thigh region Normal dp/pt pulses b/l  Skin:    General: Skin is warm and dry.     Capillary Refill: Capillary refill takes less than 2 seconds.  Neurological:     Mental Status: He is alert.     Comments: 5/5 strength in LLE and RLE Sensation to light touch intact in LLE and RLE  Psychiatric:        Mood and Affect: Mood normal.    ED Results / Procedures / Treatments   Labs (all labs ordered are listed, but only abnormal results are displayed) Labs Reviewed - No data to display  EKG None  Radiology US Venous Img Lower  Left (DVT Study)  Result Date:  12/11/2020 CLINICAL DATA:  Left lower extremity pain.  Evaluate for DVT. EXAM: LEFT LOWER EXTREMITY VENOUS DOPPLER ULTRASOUND TECHNIQUE: Gray-scale sonography with graded compression, as well as color Doppler and duplex ultrasound were performed to evaluate the lower extremity deep venous systems from the level of the common femoral vein and including the common femoral, femoral, profunda femoral, popliteal and calf veins including the posterior tibial, peroneal and gastrocnemius veins when visible. The superficial great saphenous vein was also interrogated. Spectral Doppler  was utilized to evaluate flow at rest and with distal augmentation maneuvers in the common femoral, femoral and popliteal veins. COMPARISON:  None. FINDINGS: Contralateral Common Femoral Vein: Respiratory phasicity is normal and symmetric with the symptomatic side. No evidence of thrombus. Normal compressibility. Common Femoral Vein: No evidence of thrombus. Normal compressibility, respiratory phasicity and response to augmentation. Saphenofemoral Junction: No evidence of thrombus. Normal compressibility and flow on color Doppler imaging. Profunda Femoral Vein: No evidence of thrombus. Normal compressibility and flow on color Doppler imaging. Femoral Vein: No evidence of thrombus. Normal compressibility, respiratory phasicity and response to augmentation. Popliteal Vein: No evidence of thrombus. Normal compressibility, respiratory phasicity and response to augmentation. Calf Veins: No evidence of thrombus. Normal compressibility and flow on color Doppler imaging. Superficial Great Saphenous Vein: No evidence of thrombus. Normal compressibility. Venous Reflux:  None. Other Findings:  None. IMPRESSION: No evidence of DVT within the left lower extremity. Electronically Signed   By: Sandi Mariscal M.D.   On: 12/11/2020 12:37    Procedures Procedures   Medications Ordered in ED Medications  methocarbamol (ROBAXIN) tablet 500 mg (500 mg Oral  Given 12/11/20 1155)  ketorolac (TORADOL) 30 MG/ML injection 30 mg (30 mg Intramuscular Given 12/11/20 1155)  oxyCODONE-acetaminophen (PERCOCET/ROXICET) 5-325 MG per tablet 1 tablet (1 tablet Oral Given 12/11/20 1155)  HYDROmorphone (DILAUDID) injection 1 mg (1 mg Intramuscular Given 12/11/20 1304)    ED Course  I have reviewed the triage vital signs and the nursing notes.  Pertinent labs & imaging results that were available during my care of the patient were reviewed by me and considered in my medical decision making (see chart for details).    MDM Rules/Calculators/A&P                           60 year old male presents to ER with concern for low back pain radiating to left leg.  On exam he appears well in no distress, neuro vascularly intact.  No red flag symptoms.  Today pain seems to be more left-sided than previously, does have some tenderness in the left thigh region though there was no deformity appreciated.  Did check DVT study which was negative.  Suspect sciatica.  Given current exam, do not feel patient needs emergent MRI today.  Recommend that he have follow-up with ortho or spine.  Pain is currently well controlled in ER.  Reviewed return precautions and discharged.  After the discussed management above, the patient was determined to be safe for discharge.  The patient was in agreement with this plan and all questions regarding their care were answered.  ED return precautions were discussed and the patient will return to the ED with any significant worsening of condition.   Final Clinical Impression(s) / ED Diagnoses Final diagnoses:  Sciatica of left side    Rx / DC Orders ED Discharge Orders          Ordered    oxyCODONE-acetaminophen (PERCOCET/ROXICET) 5-325 MG tablet  Every 6 hours PRN        12/11/20 1443             Lucrezia Starch, MD 12/11/20 1945

## 2020-12-11 NOTE — Discharge Instructions (Signed)
Recommend calling orthopedic surgery on Monday morning to request closer follow-up appointment.  Continue Tylenol and Motrin as needed for pain control.  For breakthrough pain can take the prescribed Percocet.  Note this can make you drowsy and should not take while driving.  Come back to ER if you develop uncontrolled pain, vomiting, bladder or bowel incontinence, weakness in your legs or other new concerning symptom.

## 2020-12-11 NOTE — ED Notes (Signed)
Pt transported to [facility]

## 2020-12-11 NOTE — ED Triage Notes (Signed)
Pt presents to ED POV. Pt c/o lower L back pain and radiates down L leg. Pt evaluated last week at [facility], xray done. Pt able to transfer from wheelchair to bed with minimal assist. Pt reports RX prednisone initially helped but pain has worsened since wed.

## 2020-12-15 DIAGNOSIS — M5126 Other intervertebral disc displacement, lumbar region: Secondary | ICD-10-CM | POA: Insufficient documentation

## 2020-12-31 ENCOUNTER — Encounter (HOSPITAL_BASED_OUTPATIENT_CLINIC_OR_DEPARTMENT_OTHER): Payer: Self-pay | Admitting: Emergency Medicine

## 2020-12-31 ENCOUNTER — Other Ambulatory Visit: Payer: Self-pay

## 2020-12-31 ENCOUNTER — Emergency Department (HOSPITAL_BASED_OUTPATIENT_CLINIC_OR_DEPARTMENT_OTHER)
Admission: EM | Admit: 2020-12-31 | Discharge: 2020-12-31 | Disposition: A | Discharge status: Home / Self Care | Payer: 59 | Attending: Emergency Medicine | Admitting: Emergency Medicine

## 2020-12-31 DIAGNOSIS — Z7982 Long term (current) use of aspirin: Secondary | ICD-10-CM | POA: Diagnosis not present

## 2020-12-31 DIAGNOSIS — Z87891 Personal history of nicotine dependence: Secondary | ICD-10-CM | POA: Diagnosis not present

## 2020-12-31 DIAGNOSIS — M79622 Pain in left upper arm: Secondary | ICD-10-CM | POA: Insufficient documentation

## 2020-12-31 DIAGNOSIS — Z79899 Other long term (current) drug therapy: Secondary | ICD-10-CM | POA: Diagnosis not present

## 2020-12-31 DIAGNOSIS — I10 Essential (primary) hypertension: Secondary | ICD-10-CM | POA: Diagnosis not present

## 2020-12-31 LAB — CBC WITH DIFFERENTIAL/PLATELET
Abs Immature Granulocytes: 0.01 10*3/uL (ref 0.00–0.07)
Basophils Absolute: 0.1 10*3/uL (ref 0.0–0.1)
Basophils Relative: 1 %
Eosinophils Absolute: 0.3 10*3/uL (ref 0.0–0.5)
Eosinophils Relative: 5 %
HCT: 42.7 % (ref 39.0–52.0)
Hemoglobin: 14.4 g/dL (ref 13.0–17.0)
Immature Granulocytes: 0 %
Lymphocytes Relative: 35 %
Lymphs Abs: 1.9 10*3/uL (ref 0.7–4.0)
MCH: 30.4 pg (ref 26.0–34.0)
MCHC: 33.7 g/dL (ref 30.0–36.0)
MCV: 90.3 fL (ref 80.0–100.0)
Monocytes Absolute: 0.6 10*3/uL (ref 0.1–1.0)
Monocytes Relative: 11 %
Neutro Abs: 2.5 10*3/uL (ref 1.7–7.7)
Neutrophils Relative %: 48 %
Platelets: 283 10*3/uL (ref 150–400)
RBC: 4.73 MIL/uL (ref 4.22–5.81)
RDW: 13.9 % (ref 11.5–15.5)
WBC: 5.3 10*3/uL (ref 4.0–10.5)
nRBC: 0 % (ref 0.0–0.2)

## 2020-12-31 LAB — BASIC METABOLIC PANEL
Anion gap: 8 (ref 5–15)
BUN: 14 mg/dL (ref 8–23)
CO2: 26 mmol/L (ref 22–32)
Calcium: 9.2 mg/dL (ref 8.9–10.3)
Chloride: 102 mmol/L (ref 98–111)
Creatinine, Ser: 0.97 mg/dL (ref 0.61–1.24)
GFR, Estimated: >60 mL/min (ref >60)
Glucose, Bld: 99 mg/dL (ref 70–99)
Potassium: 3.5 mmol/L (ref 3.5–5.1)
Sodium: 136 mmol/L (ref 135–145)

## 2020-12-31 LAB — TROPONIN I (HIGH SENSITIVITY): Troponin I (High Sensitivity): 3 ng/L (ref <18)

## 2020-12-31 MED ORDER — AMLODIPINE BESYLATE 10 MG PO TABS: 10.0000 mg | ORAL_TABLET | Freq: Every day | ORAL | 0 refills | Status: DC

## 2020-12-31 NOTE — ED Triage Notes (Signed)
Pt is s/p back surgery x 2 weeks. Pt reports elevated BP and left arm pain at follow up apt yesterday. Pt states continued left arm pain and was advised by nurse line to come to ED.

## 2020-12-31 NOTE — ED Provider Notes (Addendum)
[facility] Provider Note: Georgena Spurling, MD, FACEP  CSN: [id number] MRN: [id number] ARRIVAL: 12/31/20 at [place]: [place]  Hypertension   HISTORY OF PRESENT ILLNESS  12/31/20 4:42 AM Andrew Rowland is a 60 y.o. male underwent a lumbar laminectomy on 12/15/2020 at [facility].  He had a follow-up appointment with the surgeon yesterday.  He is still having some pain and numbness in his left leg which is similar to that prior to his surgery.  His surgeon told him and that this was expected.  While he was at this appointment his blood pressure was noted to be elevated at 163/113.  He contacted a physician friend who advised him to take an extra amlodipine 5 mg which he did.  He states he has been feeling badly lately, possibly due to weaning off of oxycodone.  He took his blood pressure again this morning and it was 149/109 and he became concerned.  On arrival here it is 159/106.  He has been having an achy pain in his left upper arm posteriorly for the past 3 or 4 days.  The pain has been severe at times but he currently rates it as a 3 out of 10.  It is better or worse depending on resting position but rapid movement or palpation does not exacerbate the pain.  There is no associated chest pain, shortness of breath or nausea.   Past Medical History:  Diagnosis Date   GERD (gastroesophageal reflux disease)    Hypertension    Insomnia    Vitamin B12 deficiency    Vitamin D deficiency     Past Surgical History:  Procedure Laterality Date   BACK SURGERY     COLONOSCOPY  06/13/2012   Mild sigmoid diverticulosis. Small internal hemorrhoids. Otherwise normal colonoscopy to terminal ileum    Family History  Problem Relation Age of Onset   Hypertension Mother    Hypertension Father    Hypertension Other    Colon cancer Neg Hx     Social History   Tobacco Use   Smoking status: Former   Smokeless tobacco: Former  Scientific laboratory technician Use:  Never used  Substance Use Topics   Alcohol use: Yes    Comment: OCC   Drug use: No    Prior to Admission medications   Medication Sig Start Date End Date Taking? Authorizing Provider  amLODipine (NORVASC) 10 MG tablet Take 1 tablet (10 mg total) by mouth daily. 12/31/20  Yes Sage Kopera, MD  aspirin 81 MG tablet Take 81 mg by mouth daily.    [provider]  Cholecalciferol (VITAMIN D3) 50 MCG (2000 UT) capsule Take 1 capsule (2,000 Units total) by mouth daily. 01/10/19   Plotnikov, Evie Lacks, MD  finasteride (PROSCAR) 5 MG tablet Take 1 tablet (5 mg total) by mouth daily. 05/25/20 05/25/21  Plotnikov, Evie Lacks, MD  fluticasone furoate-vilanterol (BREO ELLIPTA) 100-25 MCG/INH AEPB Inhale 1 puff into the lungs daily. 02/02/20   Plotnikov, Evie Lacks, MD  pantoprazole (PROTONIX) 40 MG tablet Take 1 tablet (40 mg total) by mouth daily. 08/12/20   Plotnikov, Evie Lacks, MD    Allergies Lisinopril   REVIEW OF SYSTEMS  Negative except as noted here or in the History of Present Illness.   PHYSICAL EXAMINATION  Initial Vital Signs Blood pressure (!) 159/106, pulse 75, temperature 98.1 F (36.7 C), temperature source Oral, resp. rate 16, height 6' (1.829 m), weight 100.7 kg, SpO2 97 %.  Examination General: Well-developed, well-nourished male in no acute distress; appearance consistent with age of record HENT: normocephalic; atraumatic Eyes: Normal appearance Neck: supple; no change in arm pain with movement of neck Heart: regular rate and rhythm Lungs: clear to auscultation bilaterally Abdomen: soft; nondistended; nontender; bowel sounds present Extremities: No deformity; full range of motion; pulses normal; no change in pain with movement of left shoulder or palpation of left upper arm Neurologic: Awake, alert and oriented; motor function intact in all extremities and symmetric; decreased sensation in medial left foot; no facial droop Skin: Warm and dry Psychiatric:  Anxious   RESULTS  Summary of this visit's results, reviewed and interpreted by myself:   EKG Interpretation  Date/Time:  Saturday December 31 2020 04:40:16 EST Ventricular Rate:  74 PR Interval:    QRS Duration: 119 QT Interval:  395 QTC Calculation: 439 R Axis:   -29 Text Interpretation: Normal sinus rhythm Artifact Confirmed by Gervis Gaba [phone]) on 12/31/2020 4:42:13 AM       Laboratory Studies: Results for orders placed or performed during the hospital encounter of 12/31/20 (from the past 24 hour(s))  CBC with Differential/Platelet     Status: None   Collection Time: 12/31/20  5:01 AM  Result Value Ref Range   WBC 5.3 4.0 - 10.5 K/uL   RBC 4.73 4.22 - 5.81 MIL/uL   Hemoglobin 14.4 13.0 - 17.0 g/dL   HCT 42.7 39.0 - 52.0 %   MCV 90.3 80.0 - 100.0 fL   MCH 30.4 26.0 - 34.0 pg   MCHC 33.7 30.0 - 36.0 g/dL   RDW 13.9 11.5 - 15.5 %   Platelets 283 150 - 400 K/uL   nRBC 0.0 0.0 - 0.2 %   Neutrophils Relative % 48 %   Neutro Abs 2.5 1.7 - 7.7 K/uL   Lymphocytes Relative 35 %   Lymphs Abs 1.9 0.7 - 4.0 K/uL   Monocytes Relative 11 %   Monocytes Absolute 0.6 0.1 - 1.0 K/uL   Eosinophils Relative 5 %   Eosinophils Absolute 0.3 0.0 - 0.5 K/uL   Basophils Relative 1 %   Basophils Absolute 0.1 0.0 - 0.1 K/uL   Immature Granulocytes 0 %   Abs Immature Granulocytes 0.01 0.00 - 0.07 K/uL  Basic metabolic panel     Status: None   Collection Time: 12/31/20  5:01 AM  Result Value Ref Range   Sodium 136 135 - 145 mmol/L   Potassium 3.5 3.5 - 5.1 mmol/L   Chloride 102 98 - 111 mmol/L   CO2 26 22 - 32 mmol/L   Glucose, Bld 99 70 - 99 mg/dL   BUN 14 8 - 23 mg/dL   Creatinine, Ser 0.97 0.61 - 1.24 mg/dL   Calcium 9.2 8.9 - 10.3 mg/dL   GFR, Estimated >60 >60 mL/min   Anion gap 8 5 - 15  Troponin I (High Sensitivity)     Status: None   Collection Time: 12/31/20  5:01 AM  Result Value Ref Range   Troponin I (High Sensitivity) 3 <18 ng/L   Imaging Studies: No results  found.  ED COURSE and MDM  Nursing notes, initial and subsequent vitals signs, including pulse oximetry, reviewed and interpreted by myself.  Vitals:   12/31/20 0515 12/31/20 0526 12/31/20 0527 12/31/20 0530  BP:  (!) 151/101  (!) 145/103  Pulse: 71  72 68  Resp: 13  15 13   Temp:      TempSrc:      SpO2:  96%  97% 95%  Weight:      Height:       Medications - No data to display  5:33 AM The patient's lab work is reassuring.  His EKG does not show ischemic changes and his troponin is 3.  I do not believe his left arm pain is an anginal equivalent especially since it is positional (the patient now states that leaning his head back can relieve the pain in his left upper arm thus he may be having some cervical radiculopathy; he was a advised to bring this up with his spine surgeon if symptoms persist).  His blood pressure may be high because of pain, anxiety or natural progression of the disease.  We will raise his amlodipine to 10 mg daily and advised him to follow-up with his PCP.  PROCEDURES  Procedures   ED DIAGNOSES     ICD-10-CM   1. Hypertension not at goal  I10     2. Left upper arm pain  M79.622          Shanon Rosser, MD 12/31/20 0535    Shanon Rosser, MD 12/31/20 [phone]

## 2021-01-02 ENCOUNTER — Ambulatory Visit: Payer: 59 | Admitting: Internal Medicine

## 2021-01-02 ENCOUNTER — Other Ambulatory Visit: Payer: Self-pay

## 2021-01-02 ENCOUNTER — Ambulatory Visit (INDEPENDENT_AMBULATORY_CARE_PROVIDER_SITE_OTHER): Payer: 59 | Admitting: Internal Medicine

## 2021-01-02 ENCOUNTER — Encounter: Payer: Self-pay | Admitting: Internal Medicine

## 2021-01-02 DIAGNOSIS — E538 Deficiency of other specified B group vitamins: Secondary | ICD-10-CM

## 2021-01-02 DIAGNOSIS — M25512 Pain in left shoulder: Secondary | ICD-10-CM

## 2021-01-02 DIAGNOSIS — I10 Essential (primary) hypertension: Secondary | ICD-10-CM

## 2021-01-02 DIAGNOSIS — L245 Irritant contact dermatitis due to other chemical products: Secondary | ICD-10-CM | POA: Diagnosis not present

## 2021-01-02 DIAGNOSIS — M5416 Radiculopathy, lumbar region: Secondary | ICD-10-CM | POA: Insufficient documentation

## 2021-01-02 MED ORDER — TRIAMCINOLONE ACETONIDE 0.1 % EX CREA: 1.0000 "application " | TOPICAL_CREAM | Freq: Four times a day (QID) | CUTANEOUS | 1 refills | Status: DC

## 2021-01-02 MED ORDER — OLMESARTAN MEDOXOMIL 20 MG PO TABS: 20.0000 mg | ORAL_TABLET | Freq: Every day | ORAL | 3 refills | Status: DC

## 2021-01-02 MED ORDER — NAPROXEN 500 MG PO TABS: 500.0000 mg | ORAL_TABLET | Freq: Two times a day (BID) | ORAL | 1 refills | Status: DC | PRN

## 2021-01-02 MED ORDER — TRAMADOL HCL 50 MG PO TABS: 50.0000 mg | ORAL_TABLET | Freq: Four times a day (QID) | ORAL | 1 refills | Status: DC | PRN

## 2021-01-02 NOTE — Progress Notes (Signed)
Subjective:  Patient ID: Andrew Rowland, male    DOB: [birth year]  Age: 60 y.o. MRN: [id number]  CC: Follow-up (F/U ON BP)   HPI  Andrew Rowland presents for LBP and severe radiculopathy in the LLE f/u Severe LBP since Nov -pain has been improving after surgery  He was dx'd with intervertebral disc displacement, lumbar region   Lumbar HNP L3-4  Nov 29   Procedures   PR LAMNOTMY INCL W/DCMPRSN NRV ROOT 1 INTRSPC LUMBR  -- 12/15/20 - Dr Rigoberto Noel PR MICROSURG TECHNIQUES,REQ OPER MICROSCOPE    [facility] now In PT at home C/o L arm pain Pt stopped 5 d ago  C/o BP was 163/1113 - pt went to ER  C/o L shoulder pain off and on - worse at night  Outpatient Medications Prior to Visit  Medication Sig Dispense Refill   amLODipine (NORVASC) 5 MG tablet TAKE 1 TABLET BY MOUTH EVERY DAY     aspirin 81 MG tablet Take 81 mg by mouth daily.     Cholecalciferol (VITAMIN D3) 50 MCG (2000 UT) capsule Take 1 capsule (2,000 Units total) by mouth daily. 100 capsule 3   finasteride (PROSCAR) 5 MG tablet Take 1 tablet (5 mg total) by mouth daily. 90 tablet 3   fluticasone furoate-vilanterol (BREO ELLIPTA) 100-25 MCG/INH AEPB Inhale 1 puff into the lungs daily. 1 each 11   pantoprazole (PROTONIX) 40 MG tablet Take 1 tablet (40 mg total) by mouth daily. 90 tablet 3   amLODipine (NORVASC) 10 MG tablet Take 1 tablet (10 mg total) by mouth daily. (Patient not taking: Reported on 01/02/2021) 30 tablet 0   No facility-administered medications prior to visit.    ROS: Review of Systems  Constitutional:  Positive for fatigue. Negative for appetite change and unexpected weight change.  HENT:  Negative for congestion, nosebleeds, sneezing, sore throat and trouble swallowing.   Eyes:  Negative for itching and visual disturbance.  Respiratory:  Negative for cough.   Cardiovascular:  Negative for chest pain, palpitations and leg swelling.  Gastrointestinal:  Negative for abdominal distention, blood in stool, diarrhea  and nausea.  Genitourinary:  Negative for frequency and hematuria.  Musculoskeletal:  Positive for back pain. Negative for gait problem, joint swelling and neck pain.  Skin:  Positive for rash.  Neurological:  Positive for weakness. Negative for dizziness, tremors and speech difficulty.  Psychiatric/Behavioral:  Negative for agitation, dysphoric mood and sleep disturbance. The patient is not nervous/anxious.    Objective:  BP 138/82 (BP Location: Left Arm)    Pulse 80    Temp 98.3 F (36.8 C) (Oral)    Ht 6' (1.829 m)    Wt 225 lb 12.8 oz (102.4 kg)    SpO2 97%    BMI 30.62 kg/m   BP Readings from Last 3 Encounters:  01/02/21 138/82  12/31/20 (!) 145/103  12/11/20 (!) 142/102    Wt Readings from Last 3 Encounters:  01/02/21 225 lb 12.8 oz (102.4 kg)  12/31/20 222 lb (100.7 kg)  12/03/20 225 lb (102.1 kg)    Physical Exam Constitutional:      General: He is not in acute distress.    Appearance: He is well-developed. He is not ill-appearing.     Comments: NAD  Eyes:     Conjunctiva/sclera: Conjunctivae normal.     Pupils: Pupils are equal, round, and reactive to light.  Neck:     Thyroid: No thyromegaly.     Vascular: No JVD.  Cardiovascular:  Rate and Rhythm: Normal rate and regular rhythm.     Heart sounds: Normal heart sounds. No murmur heard.   No friction rub. No gallop.  Pulmonary:     Effort: Pulmonary effort is normal. No respiratory distress.     Breath sounds: Normal breath sounds. No wheezing or rales.  Chest:     Chest wall: No tenderness.  Abdominal:     General: Bowel sounds are normal. There is no distension.     Palpations: Abdomen is soft. There is no mass.     Tenderness: There is no abdominal tenderness. There is no guarding or rebound.  Musculoskeletal:        General: No tenderness. Normal range of motion.     Cervical back: Normal range of motion.  Lymphadenopathy:     Cervical: No cervical adenopathy.  Skin:    General: Skin is warm and  dry.     Findings: Erythema and rash present.  Neurological:     Mental Status: He is alert and oriented to person, place, and time.     Cranial Nerves: No cranial nerve deficit.     Motor: No abnormal muscle tone.     Coordination: Coordination abnormal.     Gait: Gait abnormal.     Deep Tendon Reflexes: Reflexes are normal and symmetric.  Psychiatric:        Behavior: Behavior normal.        Thought Content: Thought content normal.        Judgment: Judgment normal.  Limping, slightly unsteady Wound of the lumbar spine is clean with surrounding square-like erythematous rash  Lab Results  Component Value Date   WBC 5.3 12/31/2020   HGB 14.4 12/31/2020   HCT 42.7 12/31/2020   PLT 283 12/31/2020   GLUCOSE 99 12/31/2020   CHOL 191 02/02/2020   TRIG 99.0 02/02/2020   HDL 47.20 02/02/2020   LDLCALC 124 (H) 02/02/2020   ALT 14 02/02/2020   AST 19 02/02/2020   NA 136 12/31/2020   K 3.5 12/31/2020   CL 102 12/31/2020   CREATININE 0.97 12/31/2020   BUN 14 12/31/2020   CO2 26 12/31/2020   TSH 3.22 02/02/2020   PSA 2.87 02/02/2020    No results found.  Assessment & Plan:   Problem List Items Addressed This Visit     B12 deficiency    Cont w/B12      Contact dermatitis    Rash likely related to dressing adhesive on the back wound.  Prescribed triamcinolone cream      HTN (hypertension)    Worse, likely due to pain and stress.  Added olmesartan 20 mg a day.  Continue with amlodipine -reduce the dose to 5 mg to avoid leg swelling.      Relevant Medications   amLODipine (NORVASC) 5 MG tablet   olmesartan (BENICAR) 20 MG tablet   Radiculopathy of lumbar region   Shoulder pain     L new shoulder and upper anterior arm- MSK pain.  Ice and heat.  Tramadol as needed naproxen as needed.  No injury.  Return to clinic if not better         Meds ordered this encounter  Medications   triamcinolone cream (KENALOG) 0.1 %    Sig: Apply 1 application topically 4 (four) times  daily.    Dispense:  80 g    Refill:  1   traMADol (ULTRAM) 50 MG tablet    Sig: Take 1 tablet (50 mg total)  by mouth every 6 (six) hours as needed.    Dispense:  20 tablet    Refill:  1   naproxen (NAPROSYN) 500 MG tablet    Sig: Take 1 tablet (500 mg total) by mouth 2 (two) times daily as needed for moderate pain.    Dispense:  60 tablet    Refill:  1   olmesartan (BENICAR) 20 MG tablet    Sig: Take 1 tablet (20 mg total) by mouth daily.    Dispense:  90 tablet    Refill:  3      Follow-up: Return in about 4 weeks (around 01/30/2021) for a follow-up visit.  Walker Kehr, MD

## 2021-01-02 NOTE — Patient Instructions (Signed)
Rice heating pad

## 2021-01-03 ENCOUNTER — Telehealth: Payer: Self-pay | Admitting: Internal Medicine

## 2021-01-03 DIAGNOSIS — M25519 Pain in unspecified shoulder: Secondary | ICD-10-CM | POA: Insufficient documentation

## 2021-01-03 DIAGNOSIS — L259 Unspecified contact dermatitis, unspecified cause: Secondary | ICD-10-CM | POA: Insufficient documentation

## 2021-01-03 NOTE — Telephone Encounter (Signed)
Pt connected to [facility] 12.16.2022.    Caller states he went to get staples removed (back surgery 2 weeks ago) and nurse said his blood pressure was high 168/113. States he was in pain at the time and is still having the same back pain. States his BP has been high in the past. Takes amlodipine 5mg . Was advised today to resume aspirin 81mg  that they had discontinued prior to surgery.    Advised to see PCP within 24 hours. Appt was scheduled.

## 2021-01-03 NOTE — Assessment & Plan Note (Signed)
Worse, likely due to pain and stress.  Added olmesartan 20 mg a day.  Continue with amlodipine -reduce the dose to 5 mg to avoid leg swelling.

## 2021-01-03 NOTE — Assessment & Plan Note (Signed)
L new shoulder and upper anterior arm- MSK pain.  Ice and heat.  Tramadol as needed naproxen as needed.  No injury.  Return to clinic if not better

## 2021-01-03 NOTE — Assessment & Plan Note (Signed)
Rash likely related to dressing adhesive on the back wound.  Prescribed triamcinolone cream

## 2021-01-03 NOTE — Assessment & Plan Note (Signed)
Cont w/B12 

## 2021-01-06 ENCOUNTER — Other Ambulatory Visit: Payer: Self-pay | Admitting: Internal Medicine

## 2021-01-19 DIAGNOSIS — Z9889 Other specified postprocedural states: Secondary | ICD-10-CM | POA: Insufficient documentation

## 2021-01-23 ENCOUNTER — Ambulatory Visit: Payer: 59 | Admitting: Internal Medicine

## 2021-02-02 ENCOUNTER — Other Ambulatory Visit: Payer: Self-pay

## 2021-02-02 ENCOUNTER — Encounter: Payer: Self-pay | Admitting: Internal Medicine

## 2021-02-02 ENCOUNTER — Ambulatory Visit (INDEPENDENT_AMBULATORY_CARE_PROVIDER_SITE_OTHER): Payer: 59 | Admitting: Internal Medicine

## 2021-02-02 VITALS — BP 118/80 | HR 88 | Temp 98.3°F | Ht 72.0 in | Wt 225.0 lb

## 2021-02-02 DIAGNOSIS — E538 Deficiency of other specified B group vitamins: Secondary | ICD-10-CM

## 2021-02-02 DIAGNOSIS — M5416 Radiculopathy, lumbar region: Secondary | ICD-10-CM | POA: Diagnosis not present

## 2021-02-02 DIAGNOSIS — Z0000 Encounter for general adult medical examination without abnormal findings: Secondary | ICD-10-CM

## 2021-02-02 LAB — URINALYSIS
Bilirubin Urine: NEGATIVE
Hgb urine dipstick: NEGATIVE
Ketones, ur: NEGATIVE
Leukocytes,Ua: NEGATIVE
Nitrite: NEGATIVE
Specific Gravity, Urine: 1.01 (ref 1.000–1.030)
Total Protein, Urine: NEGATIVE
Urine Glucose: NEGATIVE
Urobilinogen, UA: 0.2 (ref 0.0–1.0)
pH: 6 (ref 5.0–8.0)

## 2021-02-02 LAB — COMPREHENSIVE METABOLIC PANEL
ALT: 13 U/L (ref 0–53)
AST: 15 U/L (ref 0–37)
Albumin: 4.4 g/dL (ref 3.5–5.2)
Alkaline Phosphatase: 62 U/L (ref 39–117)
BUN: 16 mg/dL (ref 6–23)
CO2: 30 mEq/L (ref 19–32)
Calcium: 9.4 mg/dL (ref 8.4–10.5)
Chloride: 103 mEq/L (ref 96–112)
Creatinine, Ser: 1.13 mg/dL (ref 0.40–1.50)
GFR: 70.36 mL/min (ref >60.00)
Glucose, Bld: 97 mg/dL (ref 70–99)
Potassium: 4.7 mEq/L (ref 3.5–5.1)
Sodium: 138 mEq/L (ref 135–145)
Total Bilirubin: 0.6 mg/dL (ref 0.2–1.2)
Total Protein: 7 g/dL (ref 6.0–8.3)

## 2021-02-02 LAB — CBC WITH DIFFERENTIAL/PLATELET
Basophils Absolute: 0.1 10*3/uL (ref 0.0–0.1)
Basophils Relative: 1.1 % (ref 0.0–3.0)
Eosinophils Absolute: 0.3 10*3/uL (ref 0.0–0.7)
Eosinophils Relative: 4.8 % (ref 0.0–5.0)
HCT: 44.7 % (ref 39.0–52.0)
Hemoglobin: 14.7 g/dL (ref 13.0–17.0)
Lymphocytes Relative: 32.5 % (ref 12.0–46.0)
Lymphs Abs: 2 10*3/uL (ref 0.7–4.0)
MCHC: 32.9 g/dL (ref 30.0–36.0)
MCV: 90.4 fl (ref 78.0–100.0)
Monocytes Absolute: 0.7 10*3/uL (ref 0.1–1.0)
Monocytes Relative: 11.7 % (ref 3.0–12.0)
Neutro Abs: 3 10*3/uL (ref 1.4–7.7)
Neutrophils Relative %: 49.9 % (ref 43.0–77.0)
Platelets: 258 10*3/uL (ref 150.0–400.0)
RBC: 4.94 Mil/uL (ref 4.22–5.81)
RDW: 14.5 % (ref 11.5–15.5)
WBC: 6.1 10*3/uL (ref 4.0–10.5)

## 2021-02-02 LAB — LIPID PANEL
Cholesterol: 198 mg/dL (ref 0–200)
HDL: 43.4 mg/dL (ref >39.00)
LDL Cholesterol: 138 mg/dL — ABNORMAL HIGH (ref 0–99)
NonHDL: 155.06
Total CHOL/HDL Ratio: 5
Triglycerides: 84 mg/dL (ref 0.0–149.0)
VLDL: 16.8 mg/dL (ref 0.0–40.0)

## 2021-02-02 LAB — TSH: TSH: 2.87 u[IU]/mL (ref 0.35–5.50)

## 2021-02-02 LAB — PSA: PSA: 1.64 ng/mL (ref 0.10–4.00)

## 2021-02-02 NOTE — Progress Notes (Signed)
Subjective:  Patient ID: Andrew Rowland, male    DOB: [birth year]  Age: 61 y.o. MRN: [id number]  CC: No chief complaint on file.   HPI Andreu MELBERT BOTELHO presents for a well exam F/u on radiculopathy - LLE  Outpatient Medications Prior to Visit  Medication Sig Dispense Refill   amLODipine (NORVASC) 5 MG tablet TAKE 1 TABLET BY MOUTH EVERY DAY 90 tablet 3   aspirin 81 MG tablet Take 81 mg by mouth daily.     Cholecalciferol (VITAMIN D3) 50 MCG (2000 UT) capsule Take 1 capsule (2,000 Units total) by mouth daily. 100 capsule 3   finasteride (PROSCAR) 5 MG tablet Take 1 tablet (5 mg total) by mouth daily. 90 tablet 3   fluticasone furoate-vilanterol (BREO ELLIPTA) 100-25 MCG/INH AEPB Inhale 1 puff into the lungs daily. 1 each 11   olmesartan (BENICAR) 20 MG tablet Take 1 tablet (20 mg total) by mouth daily. 90 tablet 3   pantoprazole (PROTONIX) 40 MG tablet Take 1 tablet (40 mg total) by mouth daily. 90 tablet 3   triamcinolone cream (KENALOG) 0.1 % Apply 1 application topically 4 (four) times daily. 80 g 1   amLODipine (NORVASC) 5 MG tablet Take by mouth.     methocarbamol (ROBAXIN) 500 MG tablet Take 500 mg by mouth 3 (three) times daily as needed.     naproxen (NAPROSYN) 500 MG tablet Take 1 tablet (500 mg total) by mouth 2 (two) times daily as needed for moderate pain. 60 tablet 1   naproxen (NAPROSYN) 500 MG tablet Take by mouth.     traMADol (ULTRAM) 50 MG tablet Take 1 tablet (50 mg total) by mouth every 6 (six) hours as needed. 20 tablet 1   famotidine (PEPCID) 40 MG tablet Take 1 tablet by mouth at bedtime.     No facility-administered medications prior to visit.    ROS: Review of Systems  Constitutional:  Negative for appetite change, fatigue and unexpected weight change.  HENT:  Negative for congestion, nosebleeds, sneezing, sore throat and trouble swallowing.   Eyes:  Negative for itching and visual disturbance.  Respiratory:  Negative for cough.   Cardiovascular:   Negative for chest pain, palpitations and leg swelling.  Gastrointestinal:  Negative for abdominal distention, blood in stool, diarrhea and nausea.  Genitourinary:  Negative for frequency and hematuria.  Musculoskeletal:  Positive for gait problem. Negative for back pain, joint swelling and neck pain.  Skin:  Negative for rash.  Neurological:  Positive for weakness. Negative for dizziness, tremors and speech difficulty.  Psychiatric/Behavioral:  Negative for agitation, dysphoric mood, sleep disturbance and suicidal ideas. The patient is not nervous/anxious.    Objective:  BP 118/80 (BP Location: Left Arm, Patient Position: Sitting, Cuff Size: Large)    Pulse 88    Temp 98.3 F (36.8 C) (Oral)    Ht 6' (1.829 m)    Wt 225 lb (102.1 kg)    SpO2 97%    BMI 30.52 kg/m   BP Readings from Last 3 Encounters:  02/02/21 118/80  01/02/21 138/82  12/31/20 (!) 145/103    Wt Readings from Last 3 Encounters:  02/02/21 225 lb (102.1 kg)  01/02/21 225 lb 12.8 oz (102.4 kg)  12/31/20 222 lb (100.7 kg)    Physical Exam Constitutional:      General: He is not in acute distress.    Appearance: He is well-developed. He is obese.     Comments: NAD  Eyes:     Conjunctiva/sclera:  Conjunctivae normal.     Pupils: Pupils are equal, round, and reactive to light.  Neck:     Thyroid: No thyromegaly.     Vascular: No JVD.  Cardiovascular:     Rate and Rhythm: Normal rate and regular rhythm.     Heart sounds: Normal heart sounds. No murmur heard.   No friction rub. No gallop.  Pulmonary:     Effort: Pulmonary effort is normal. No respiratory distress.     Breath sounds: Normal breath sounds. No wheezing or rales.  Chest:     Chest wall: No tenderness.  Abdominal:     General: Bowel sounds are normal. There is no distension.     Palpations: Abdomen is soft. There is no mass.     Tenderness: There is no abdominal tenderness. There is no guarding or rebound.  Genitourinary:    Rectum: Guaiac result  negative.  Musculoskeletal:        General: Tenderness present. Normal range of motion.     Cervical back: Normal range of motion.  Lymphadenopathy:     Cervical: No cervical adenopathy.  Skin:    General: Skin is warm and dry.     Findings: No rash.  Neurological:     Mental Status: He is alert and oriented to person, place, and time.     Cranial Nerves: No cranial nerve deficit.     Motor: No abnormal muscle tone.     Coordination: Coordination normal.     Gait: Gait normal.     Deep Tendon Reflexes: Reflexes are normal and symmetric.  Psychiatric:        Behavior: Behavior normal.        Thought Content: Thought content normal.        Judgment: Judgment normal.   PROSTATE 1+ Lab Results  Component Value Date   WBC 5.3 12/31/2020   HGB 14.4 12/31/2020   HCT 42.7 12/31/2020   PLT 283 12/31/2020   GLUCOSE 99 12/31/2020   CHOL 191 02/02/2020   TRIG 99.0 02/02/2020   HDL 47.20 02/02/2020   LDLCALC 124 (H) 02/02/2020   ALT 14 02/02/2020   AST 19 02/02/2020   NA 136 12/31/2020   K 3.5 12/31/2020   CL 102 12/31/2020   CREATININE 0.97 12/31/2020   BUN 14 12/31/2020   CO2 26 12/31/2020   TSH 3.22 02/02/2020   PSA 2.87 02/02/2020    No results found.  Assessment & Plan:   Problem List Items Addressed This Visit     B12 deficiency    On B12      Radiculopathy of lumbar region    LBP and severe radiculopathy in the LLE f/u He was dx'd with intervertebral disc displacement, lumbar region   Lumbar HNP L3-4  Nov 29      Well adult exam     We discussed age appropriate health related issues, including available/recomended screening tests and vaccinations. Labs were ordered to be later reviewed . All questions were answered. We discussed one or more of the following - seat belt use, use of sunscreen/sun exposure exercise, fall risk reduction, second hand smoke exposure, firearm use and storage, seat belt use, a need for adhering to healthy diet and exercise. Labs were  ordered.  All questions were answered.         Relevant Orders   TSH   Urinalysis   CBC with Differential/Platelet   Lipid panel   Comprehensive metabolic panel   PSA  No orders of the defined types were placed in this encounter.     Follow-up: No follow-ups on file.  Walker Kehr, MD

## 2021-02-02 NOTE — Assessment & Plan Note (Signed)

## 2021-02-02 NOTE — Assessment & Plan Note (Signed)
On B12 

## 2021-02-02 NOTE — Assessment & Plan Note (Signed)
LBP and severe radiculopathy in the LLE f/u He was dx'd with intervertebral disc displacement, lumbar region  Lumbar HNP L3-4 Nov 29

## 2021-02-03 ENCOUNTER — Other Ambulatory Visit: Payer: Self-pay | Admitting: Internal Medicine

## 2021-02-05 ENCOUNTER — Other Ambulatory Visit: Payer: Self-pay | Admitting: Internal Medicine

## 2021-02-28 ENCOUNTER — Other Ambulatory Visit: Payer: Self-pay | Admitting: Internal Medicine

## 2021-08-23 ENCOUNTER — Other Ambulatory Visit: Payer: Self-pay | Admitting: Internal Medicine

## 2021-10-24 ENCOUNTER — Ambulatory Visit (INDEPENDENT_AMBULATORY_CARE_PROVIDER_SITE_OTHER): Payer: 59 | Admitting: Pulmonary Disease

## 2021-10-24 ENCOUNTER — Encounter: Payer: Self-pay | Admitting: Pulmonary Disease

## 2021-10-24 VITALS — BP 128/80 | HR 86 | Temp 98.4°F | Ht 72.0 in | Wt 233.0 lb

## 2021-10-24 DIAGNOSIS — G4733 Obstructive sleep apnea (adult) (pediatric): Secondary | ICD-10-CM

## 2021-10-24 DIAGNOSIS — J4521 Mild intermittent asthma with (acute) exacerbation: Secondary | ICD-10-CM

## 2021-10-24 MED ORDER — HYDROCODONE BIT-HOMATROP MBR 5-1.5 MG/5ML PO SOLN: 5.0000 mL | Freq: Four times a day (QID) | ORAL | 0 refills | Status: DC | PRN

## 2021-10-24 MED ORDER — AZITHROMYCIN 250 MG PO TABS: ORAL_TABLET | ORAL | 0 refills | Status: DC

## 2021-10-24 NOTE — Assessment & Plan Note (Signed)
X Zpak  Hycodan cough syrup 42m tid as needed for cough  OK to take decongestant as needed (sinus PE  [facility] brand)  Hold off steroids unless worse

## 2021-10-24 NOTE — Patient Instructions (Addendum)
X Zpak  Hycodan cough syrup 68m tid as needed for cough  OK to take decongestant as needed (sinus PE  [facility] brand)  Trial of nasal mask for sleep apnea

## 2021-10-24 NOTE — Progress Notes (Signed)
Subjective:    Patient ID: Andrew Rowland, male    DOB: 04/08/1960, 61 y.o.   MRN: [id number]  HPI  61 yo with OSA  He had an episode of post bronchitic chronic cough in January 2020 which finally resolved after 12 weeks  Chief Complaint  Patient presents with   Acute Visit    Pt started having complaints of cough which began about 4 days ago. Pt took a covid test which came back negative. States that he has had a mild temp. Symptoms started with sneezing and also states he has had mild wheezing.   He reports an acute episode that started 4 days ago with sneezing followed by coughing, he had low-grade fever for 1 day.  He took DayQuil and had night sweats.  He now reports cough with green sputum. COVID testing was negative. His wife is coming down with similar symptoms for 1 day no other sick contacts  He had a similar episode 2 years ago when the cough lasted for about 8 weeks before finally resolving with Breo.  Reviewed previous spirometry  He could not tolerate CPAP, he still has the machine but is not using he was provided with a fullface mask  He is planning a trip to [place] for the coming weekend This father is 62 years old and has been diagnosed with lung cancer   Significant tests/ events reviewed  03/2019 HST (217 lbs ) which showed moderate OSA 26/h, severe in supine position  Spirometry 02/2020 Obstruction, ratio 83, FEV1 75%, FVC 71%, 10% improvement postbronchodilator     Past Medical History:  Diagnosis Date   GERD (gastroesophageal reflux disease)    Hypertension    Insomnia    Vitamin B12 deficiency    Vitamin D deficiency    Past Surgical History:  Procedure Laterality Date   BACK SURGERY     COLONOSCOPY  06/13/2012   Mild sigmoid diverticulosis. Small internal hemorrhoids. Otherwise normal colonoscopy to terminal ileum    Allergies  Allergen Reactions   Lisinopril     REACTION: cough    Social History   Socioeconomic History   Marital  status: Married    Spouse name: Not on file   Number of children: Not on file   Years of education: Not on file   Highest education level: Not on file  Occupational History   Not on file  Tobacco Use   Smoking status: Former   Smokeless tobacco: Former  Scientific laboratory technician Use: Never used  Substance and Sexual Activity   Alcohol use: Yes    Comment: OCC   Drug use: No   Sexual activity: Yes  Other Topics Concern   Not on file  Social History Narrative   Regular Exercise- No tennis   Social Determinants of Health   Financial Resource Strain: Not on file  Food Insecurity: Not on file  Transportation Needs: Not on file  Physical Activity: Not on file  Stress: Not on file  Social Connections: Not on file  Intimate Partner Violence: Not on file    Family History  Problem Relation Age of Onset   Hypertension Mother    Hypertension Father    Hypertension Other    Colon cancer Neg Hx      Review of Systems  Constitutional: negative for anorexia, fevers and sweats  Eyes: negative for irritation, redness and visual disturbance  Ears, nose, mouth, throat, and face: negative for earaches, epistaxis, nasal congestion and sore throat  Cardiovascular: negative for chest pain,  lower extremity edema, orthopnea, palpitations and syncope  Gastrointestinal: negative for abdominal pain, constipation, diarrhea, melena, nausea and vomiting  Genitourinary:negative for dysuria, frequency and hematuria  Hematologic/lymphatic: negative for bleeding, easy bruising and lymphadenopathy  Musculoskeletal:negative for arthralgias, muscle weakness and stiff joints  Neurological: negative for coordination problems, gait problems, headaches and weakness  Endocrine: negative for diabetic symptoms including polydipsia, polyuria and weight loss     Objective:   Physical Exam  Gen. Pleasant, obese, in no distress, normal affect ENT - no pallor,icterus, no post nasal drip, class 2-3 airway Neck:  No JVD, no thyromegaly, no carotid bruits Lungs: no use of accessory muscles, no dullness to percussion, decreased without rales or rhonchi  Cardiovascular: Rhythm regular, heart sounds  normal, no murmurs or gallops, no peripheral edema Abdomen: soft and non-tender, no hepatosplenomegaly, BS normal. Musculoskeletal: No deformities, no cyanosis or clubbing Neuro:  alert, non focal, no tremors       Assessment & Plan:

## 2021-10-25 DIAGNOSIS — G4733 Obstructive sleep apnea (adult) (pediatric): Secondary | ICD-10-CM | POA: Insufficient documentation

## 2021-10-25 NOTE — Assessment & Plan Note (Signed)
He has not been able to settle down with full facemask.  He has gained about 15 pounds. Discussed other types of mask interface such as nasal mask or nasal pillows which he can trial  Weight loss encouraged, compliance with goal of at least 4-6 hrs every night is the expectation. Advised against medications with sedative side effects Cautioned against driving when sleepy - understanding that sleepiness will vary on a day to day basis

## 2021-11-14 ENCOUNTER — Ambulatory Visit (INDEPENDENT_AMBULATORY_CARE_PROVIDER_SITE_OTHER): Payer: 59 | Admitting: Pulmonary Disease

## 2021-11-14 ENCOUNTER — Encounter: Payer: Self-pay | Admitting: Pulmonary Disease

## 2021-11-14 DIAGNOSIS — G4733 Obstructive sleep apnea (adult) (pediatric): Secondary | ICD-10-CM

## 2021-11-14 MED ORDER — BENZONATATE 200 MG PO CAPS: 200.0000 mg | ORAL_CAPSULE | Freq: Three times a day (TID) | ORAL | 2 refills | Status: DC | PRN

## 2021-11-14 NOTE — Assessment & Plan Note (Signed)
He wants to focus on weight loss and not pursue CPAP therapy

## 2021-11-14 NOTE — Patient Instructions (Signed)
Breo once daily- rinse mouth after use  Pantoprazole - once daily before dinner  Hycodan cough syrup twice daily as needed  X Benzonatate 200 mg tid prn # 60

## 2021-11-14 NOTE — Progress Notes (Signed)
   Subjective:    Patient ID: Andrew Rowland, male    DOB: September 19, 1960, 61 y.o.   MRN: [id number]  HPI  61 yo with OSA  - could not tolerate CPAP He had an episode of post bronchitic chronic cough in January 2020 which finally resolved after 12 weeks   Chief Complaint  Patient presents with   Follow-up    He is still having some cough and feels that he is worse, He had a CXR, and had scans done and brought them with him.     Last seen 10/10 -he reported 1 week of URI symptoms with cough.  He was about to travel to [place] so I gave a prescription for Z-Pak.  His cough has persisted since then.  He took a Z-Pak with slight improvement but cough worsened again.  He had chest x-ray performed in [place] which was reported as "cavitary and nodular infiltrate "but on my review of this chest x-ray there are no infiltrates. He underwent CT chest without contrast which was read as "interstitial markings posterior lungs and sub-Centimeter mediastinal lymphadenopathy ".  I reviewed the films myself, shows minimal atelectasis related to supine position  I reviewed all his labs which are normal including no leukocytosis, normal renal and liver function.   Significant tests/ events reviewed  03/2019 HST (217 lbs ) which showed moderate OSA 26/h, severe in supine position   Spirometry 02/2020 no obstruction, ratio 83, FEV1 75%, FVC 71%, 10% improvement postbronchodilator  Review of Systems neg for any significant sore throat, dysphagia, itching, sneezing, nasal congestion or excess/ purulent secretions, fever, chills, sweats, unintended wt loss, pleuritic or exertional cp, hempoptysis, orthopnea pnd or change in chronic leg swelling. Also denies presyncope, palpitations, heartburn, abdominal pain, nausea, vomiting, diarrhea or change in bowel or urinary habits, dysuria,hematuria, rash, arthralgias, visual complaints, headache, numbness weakness or ataxia.     Objective:   Physical Exam  Gen. Pleasant,  obese, in no distress ENT - no lesions, no post nasal drip Neck: No JVD, no thyromegaly, no carotid bruits Lungs: no use of accessory muscles, no dullness to percussion, decreased without rales or rhonchi  Cardiovascular: Rhythm regular, heart sounds  normal, no murmurs or gallops, no peripheral edema Musculoskeletal: No deformities, no cyanosis or clubbing , no tremors       Assessment & Plan:   Post bronchitic cough -lasting about 4 weeks now.  He has completed Z-Pak and another course of Augmentin for 5 days in [place]. I reviewed imaging studies from [facility] not convinced that he has ILD.  Mediastinal adenopathy is not significant. He has a previous history of slow to resolve cough.  I will continue some biotic treatment with high on cough syrup and Tessalon Perles in the daytime.  He can use pantoprazole for GERD. Previously Breo seems to have helped so we we will add that.

## 2021-12-28 ENCOUNTER — Other Ambulatory Visit: Payer: Self-pay | Admitting: Internal Medicine

## 2022-01-05 IMAGING — DX DG LUMBAR SPINE COMPLETE 4+V
5 series · 5 of 5 positions shown · non-contrast
Comparison: 07/17/2010

CLINICAL DATA: Low back pain radiating into the right leg for 2
weeks, initial encounter

EXAM:
LUMBAR SPINE - COMPLETE 4+ VIEW

[l-spine ap]
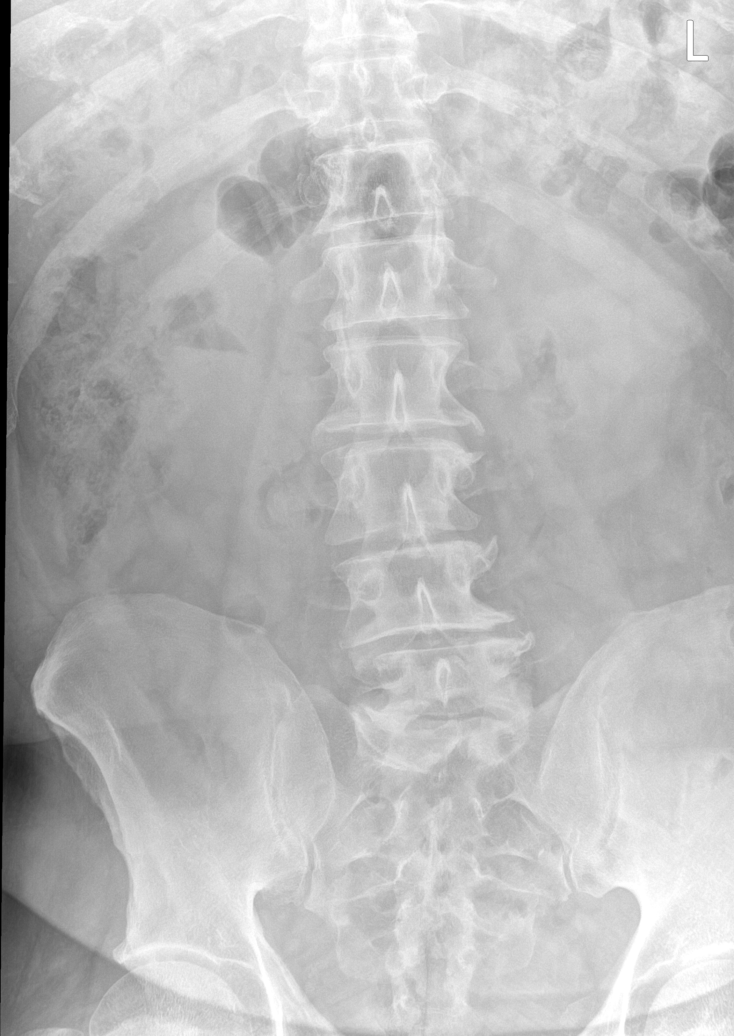

[l-spine obl (1 of 2)]
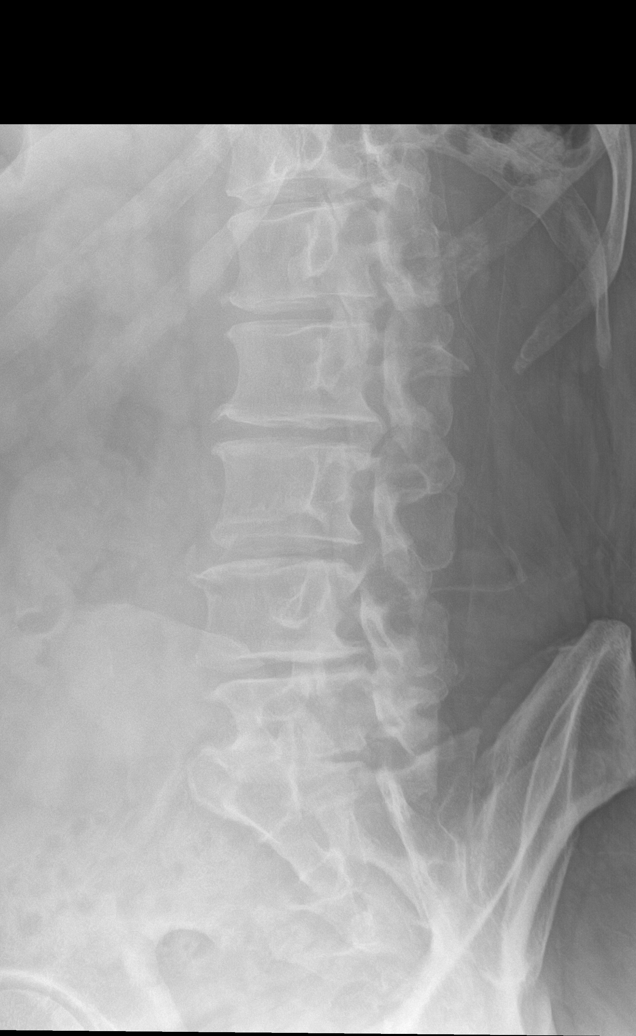

[l-spine lat]
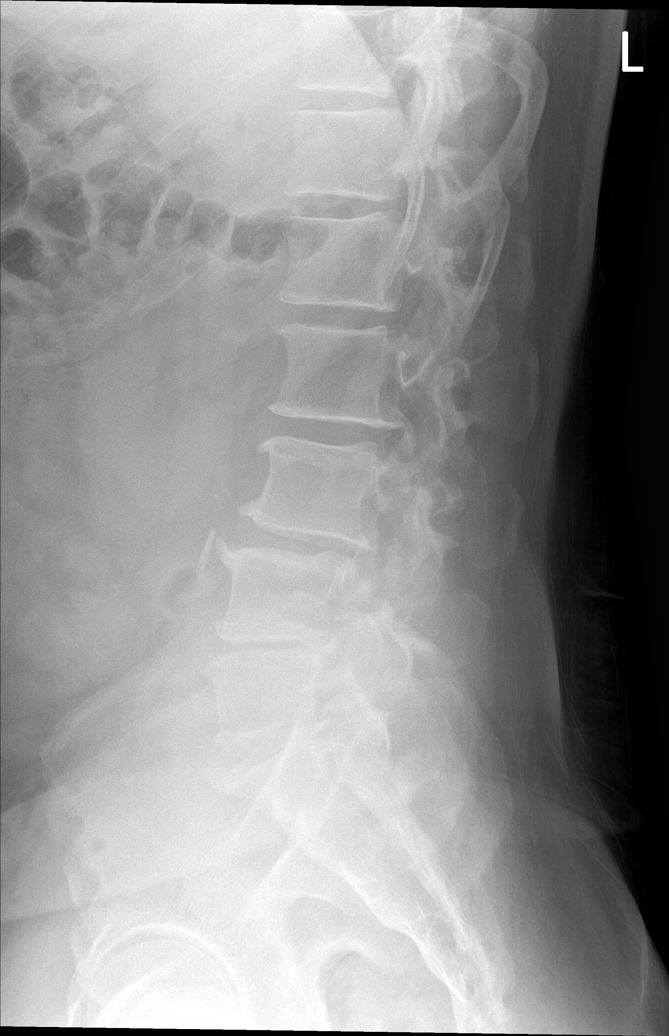

[l-spine spot]
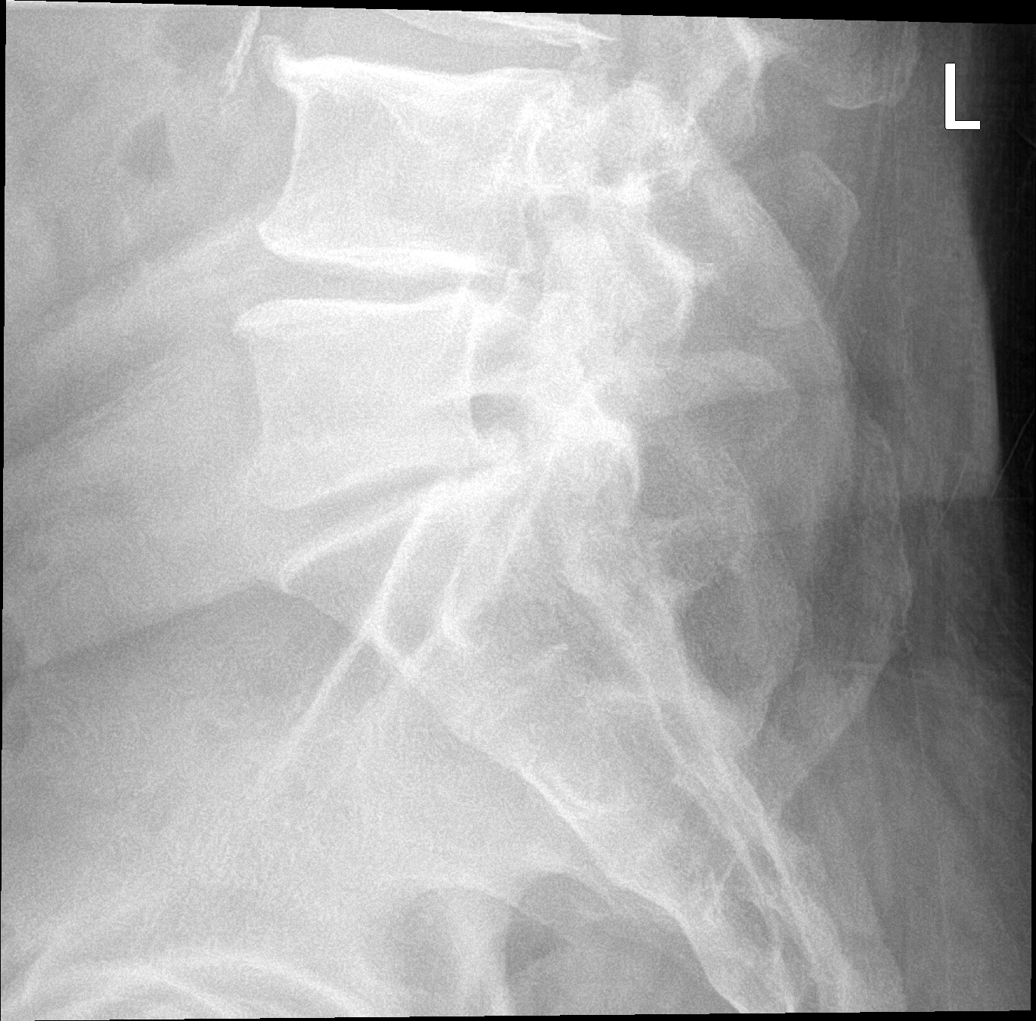

[l-spine obl (2 of 2)]
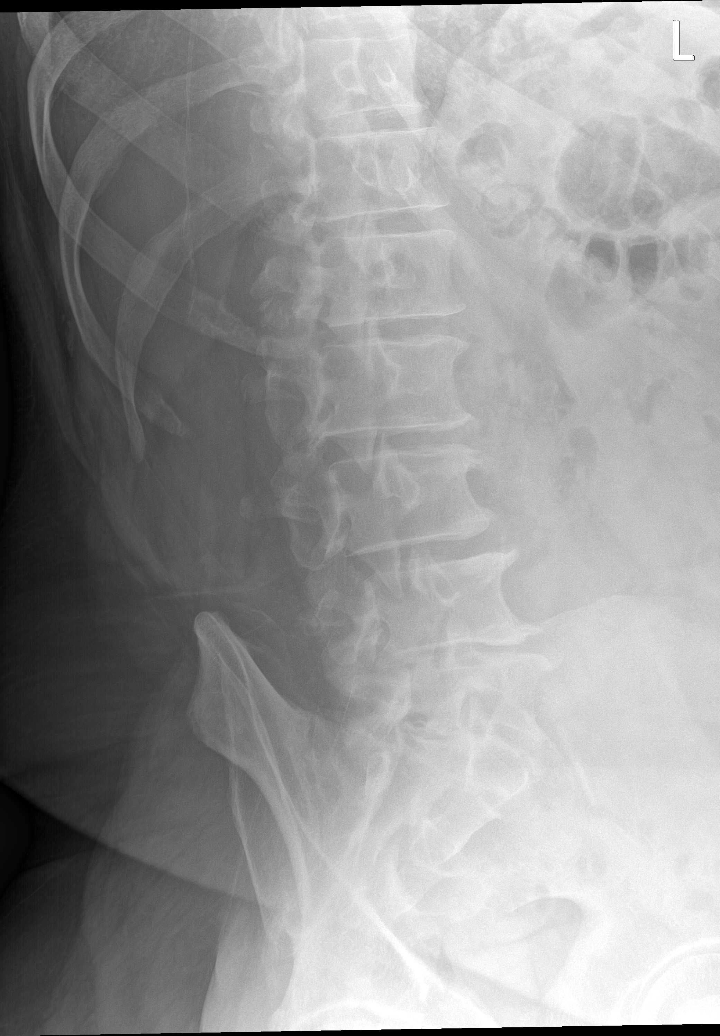

[5 of 5 positions shown; findings below may reference images not displayed]

FINDINGS: Five lumbar type vertebral bodies are well visualized. Osteophytic
changes are noted. No acute fracture is seen. No pars defects are
noted. Degenerative retrolisthesis of L3 on L4 is noted. Disc space
narrowing is noted from L3-S1. No soft tissue abnormality is noted.
IMPRESSION: Degenerative change without acute abnormality.

## 2022-01-18 ENCOUNTER — Encounter: Payer: Self-pay | Admitting: Internal Medicine

## 2022-01-18 ENCOUNTER — Ambulatory Visit (INDEPENDENT_AMBULATORY_CARE_PROVIDER_SITE_OTHER): Payer: 59 | Admitting: Internal Medicine

## 2022-01-18 VITALS — BP 110/62 | HR 75 | Temp 98.1°F | Ht 72.0 in | Wt 231.4 lb

## 2022-01-18 DIAGNOSIS — M5416 Radiculopathy, lumbar region: Secondary | ICD-10-CM

## 2022-01-18 DIAGNOSIS — Z0000 Encounter for general adult medical examination without abnormal findings: Secondary | ICD-10-CM | POA: Diagnosis not present

## 2022-01-18 DIAGNOSIS — I10 Essential (primary) hypertension: Secondary | ICD-10-CM | POA: Diagnosis not present

## 2022-01-18 DIAGNOSIS — Z125 Encounter for screening for malignant neoplasm of prostate: Secondary | ICD-10-CM

## 2022-01-18 DIAGNOSIS — E559 Vitamin D deficiency, unspecified: Secondary | ICD-10-CM | POA: Diagnosis not present

## 2022-01-18 DIAGNOSIS — E538 Deficiency of other specified B group vitamins: Secondary | ICD-10-CM | POA: Diagnosis not present

## 2022-01-18 LAB — CBC WITH DIFFERENTIAL/PLATELET
Basophils Absolute: 0.1 10*3/uL (ref 0.0–0.1)
Basophils Relative: 1.2 % (ref 0.0–3.0)
Eosinophils Absolute: 0.2 10*3/uL (ref 0.0–0.7)
Eosinophils Relative: 2.8 % (ref 0.0–5.0)
HCT: 45.9 % (ref 39.0–52.0)
Hemoglobin: 15.3 g/dL (ref 13.0–17.0)
Lymphocytes Relative: 32.1 % (ref 12.0–46.0)
Lymphs Abs: 2 10*3/uL (ref 0.7–4.0)
MCHC: 33.3 g/dL (ref 30.0–36.0)
MCV: 93.3 fl (ref 78.0–100.0)
Monocytes Absolute: 0.7 10*3/uL (ref 0.1–1.0)
Monocytes Relative: 11.3 % (ref 3.0–12.0)
Neutro Abs: 3.2 10*3/uL (ref 1.4–7.7)
Neutrophils Relative %: 52.6 % (ref 43.0–77.0)
Platelets: 325 10*3/uL (ref 150.0–400.0)
RBC: 4.92 Mil/uL (ref 4.22–5.81)
RDW: 13.8 % (ref 11.5–15.5)
WBC: 6.1 10*3/uL (ref 4.0–10.5)

## 2022-01-18 LAB — URINALYSIS
Bilirubin Urine: NEGATIVE
Hgb urine dipstick: NEGATIVE
Ketones, ur: NEGATIVE
Leukocytes,Ua: NEGATIVE
Nitrite: NEGATIVE
Specific Gravity, Urine: 1.02 (ref 1.000–1.030)
Total Protein, Urine: NEGATIVE
Urine Glucose: NEGATIVE
Urobilinogen, UA: 0.2 (ref 0.0–1.0)
pH: 6 (ref 5.0–8.0)

## 2022-01-18 LAB — LIPID PANEL
Cholesterol: 175 mg/dL (ref 0–200)
HDL: 39.7 mg/dL (ref >39.00)
LDL Cholesterol: 112 mg/dL — ABNORMAL HIGH (ref 0–99)
NonHDL: 135.71
Total CHOL/HDL Ratio: 4
Triglycerides: 119 mg/dL (ref 0.0–149.0)
VLDL: 23.8 mg/dL (ref 0.0–40.0)

## 2022-01-18 LAB — COMPREHENSIVE METABOLIC PANEL
ALT: 16 U/L (ref 0–53)
AST: 18 U/L (ref 0–37)
Albumin: 4.3 g/dL (ref 3.5–5.2)
Alkaline Phosphatase: 48 U/L (ref 39–117)
BUN: 15 mg/dL (ref 6–23)
CO2: 29 mEq/L (ref 19–32)
Calcium: 9.8 mg/dL (ref 8.4–10.5)
Chloride: 104 mEq/L (ref 96–112)
Creatinine, Ser: 1.09 mg/dL (ref 0.40–1.50)
GFR: 72.97 mL/min (ref >60.00)
Glucose, Bld: 93 mg/dL (ref 70–99)
Potassium: 4.9 mEq/L (ref 3.5–5.1)
Sodium: 140 mEq/L (ref 135–145)
Total Bilirubin: 0.6 mg/dL (ref 0.2–1.2)
Total Protein: 6.9 g/dL (ref 6.0–8.3)

## 2022-01-18 LAB — VITAMIN B12: Vitamin B-12: 366 pg/mL (ref 211–911)

## 2022-01-18 LAB — VITAMIN D 25 HYDROXY (VIT D DEFICIENCY, FRACTURES): VITD: 29.83 ng/mL — ABNORMAL LOW (ref 30.00–100.00)

## 2022-01-18 LAB — TSH: TSH: 2.11 u[IU]/mL (ref 0.35–5.50)

## 2022-01-18 LAB — PSA: PSA: 0.94 ng/mL (ref 0.10–4.00)

## 2022-01-18 NOTE — Progress Notes (Signed)
Subjective:  Patient ID: Andrew Rowland, male    DOB: 08/03/1960  Age: 62 y.o. MRN: [id number]  CC: Back Pain (Need FMLA accomodation form fil out . Drive to [place] once a week)   HPI [name] presents for a residual lumbar radiculopathy on the left. C/o mid-back jolts at times. S/p L3-L4 lumbar laminectomy and left discectomy on 12/2/202.  It feels like she is back would give out at times. Pt is driving to [place] 3 days a week - pain is much worse on those days.  He is not able to drive to [place] and back 3 times a week. Not taking B12    Outpatient Medications Prior to Visit  Medication Sig Dispense Refill   amLODipine (NORVASC) 5 MG tablet TAKE 1 TABLET BY MOUTH EVERY DAY 90 tablet 3   finasteride (PROSCAR) 5 MG tablet TAKE 1 TABLET (5 MG TOTAL) BY MOUTH DAILY. 90 tablet 3   olmesartan (BENICAR) 20 MG tablet TAKE 1 TABLET BY MOUTH EVERY DAY 90 tablet 3   pantoprazole (PROTONIX) 40 MG tablet TAKE 1 TABLET BY MOUTH EVERY DAY 90 tablet 1   benzonatate (TESSALON) 200 MG capsule Take 1 capsule (200 mg total) by mouth 3 (three) times daily as needed for cough. (Patient not taking: Reported on 01/18/2022) 30 capsule 2   Cholecalciferol (VITAMIN D3) 50 MCG (2000 UT) capsule Take 1 capsule (2,000 Units total) by mouth daily. (Patient not taking: Reported on 01/18/2022) 100 capsule 3   fluticasone furoate-vilanterol (BREO ELLIPTA) 100-25 MCG/INH AEPB Inhale 1 puff into the lungs daily. (Patient not taking: Reported on 01/18/2022) 1 each 11   HYDROcodone bit-homatropine (HYCODAN) 5-1.5 MG/5ML syrup Take 5 mLs by mouth every 6 (six) hours as needed for cough. (Patient not taking: Reported on 01/18/2022) 240 mL 0   No facility-administered medications prior to visit.    ROS: Review of Systems  Constitutional:  Negative for appetite change, fatigue and unexpected weight change.  HENT:  Negative for congestion, nosebleeds, sneezing, sore throat and trouble swallowing.   Eyes:  Negative  for itching and visual disturbance.  Respiratory:  Negative for cough.   Cardiovascular:  Negative for chest pain, palpitations and leg swelling.  Gastrointestinal:  Negative for abdominal distention, blood in stool, diarrhea and nausea.  Genitourinary:  Negative for frequency and hematuria.  Musculoskeletal:  Positive for back pain. Negative for gait problem, joint swelling and neck pain.  Skin:  Negative for rash.  Neurological:  Negative for dizziness, tremors, speech difficulty and weakness.  Psychiatric/Behavioral:  Negative for agitation, dysphoric mood, sleep disturbance and suicidal ideas. The patient is not nervous/anxious.     Objective:  BP 110/62 (BP Location: Left Arm)   Pulse 75   Temp 98.1 F (36.7 C) (Oral)   Ht 6' (1.829 m)   Wt 231 lb 6.4 oz (105 kg)   SpO2 95%   BMI 31.38 kg/m   BP Readings from Last 3 Encounters:  01/18/22 110/62  11/14/21 110/70  10/24/21 128/80    Wt Readings from Last 3 Encounters:  01/18/22 231 lb 6.4 oz (105 kg)  11/14/21 230 lb 6.4 oz (104.5 kg)  10/24/21 233 lb (105.7 kg)    Physical Exam Constitutional:      General: He is not in acute distress.    Appearance: He is well-developed. He is obese.     Comments: NAD  Eyes:     Conjunctiva/sclera: Conjunctivae normal.     Pupils: Pupils are equal, round,  and reactive to light.  Neck:     Thyroid: No thyromegaly.     Vascular: No JVD.  Cardiovascular:     Rate and Rhythm: Normal rate and regular rhythm.     Heart sounds: Normal heart sounds. No murmur heard.    No friction rub. No gallop.  Pulmonary:     Effort: Pulmonary effort is normal. No respiratory distress.     Breath sounds: Normal breath sounds. No wheezing or rales.  Chest:     Chest wall: No tenderness.  Abdominal:     General: Bowel sounds are normal. There is no distension.     Palpations: Abdomen is soft. There is no mass.     Tenderness: There is no abdominal tenderness. There is no guarding or rebound.   Musculoskeletal:        General: No tenderness. Normal range of motion.     Cervical back: Normal range of motion.  Lymphadenopathy:     Cervical: No cervical adenopathy.  Skin:    General: Skin is warm and dry.     Findings: No rash.  Neurological:     Mental Status: He is alert and oriented to person, place, and time.     Cranial Nerves: No cranial nerve deficit.     Motor: No abnormal muscle tone.     Coordination: Coordination normal.     Gait: Gait normal.     Deep Tendon Reflexes: Reflexes are normal and symmetric.  Psychiatric:        Behavior: Behavior normal.        Thought Content: Thought content normal.        Judgment: Judgment normal.    L proximal leg is weaker affecting standing on the L leg and strait line walking   Lab Results  Component Value Date   WBC 6.1 01/18/2022   HGB 15.3 01/18/2022   HCT 45.9 01/18/2022   PLT 325.0 01/18/2022   GLUCOSE 93 01/18/2022   CHOL 175 01/18/2022   TRIG 119.0 01/18/2022   HDL 39.70 01/18/2022   LDLCALC 112 (H) 01/18/2022   ALT 16 01/18/2022   AST 18 01/18/2022   NA 140 01/18/2022   K 4.9 01/18/2022   CL 104 01/18/2022   CREATININE 1.09 01/18/2022   BUN 15 01/18/2022   CO2 29 01/18/2022   TSH 2.11 01/18/2022   PSA 0.94 01/18/2022    No results found.  Assessment & Plan:   Problem List Items Addressed This Visit       Cardiovascular and Mediastinum   HTN (hypertension)    Continue with amlodipine        Nervous and Auditory   Lumbar radiculopathy - Primary    S/p surgery Dec 2022  The symptoms are worse.  There is proximal left leg weakness - clinically a at L1-L2 level.  Start physical therapy.  FMLA 4d/mo; drive to [employer] Obtain lab work       Relevant Orders   Vitamin B12 (Completed)   VITAMIN D 25 Hydroxy (Vit-D Deficiency, Fractures) (Completed)   Ambulatory referral to Physical Therapy     Other   Well adult exam   Relevant Orders   TSH (Completed)   Urinalysis  (Completed)   CBC with Differential/Platelet (Completed)   Lipid panel (Completed)   PSA (Completed)   Comprehensive metabolic panel (Completed)   Vitamin B12 (Completed)   VITAMIN D 25 Hydroxy (Vit-D Deficiency, Fractures) (Completed)   Vitamin D deficiency    Restart vitamin D  B12 deficiency    Check B12.  Restart vitamin B12      Relevant Orders   Vitamin B12 (Completed)      No orders of the defined types were placed in this encounter.     Follow-up: No follow-ups on file.  Walker Kehr, MD

## 2022-01-18 NOTE — Assessment & Plan Note (Addendum)
Check B12.  Restart vitamin B12

## 2022-01-18 NOTE — Assessment & Plan Note (Addendum)
S/p surgery Dec 2022  The symptoms are worse.  There is proximal left leg weakness - clinically a at L1-L2 level.  Start physical therapy.  FMLA 4d/mo; drive to [employer] Obtain lab work

## 2022-01-21 NOTE — Assessment & Plan Note (Signed)
Restart vitamin D

## 2022-01-21 NOTE — Assessment & Plan Note (Signed)
Continue with amlodipine 

## 2022-01-24 ENCOUNTER — Ambulatory Visit: Payer: 59 | Admitting: Internal Medicine

## 2022-02-01 ENCOUNTER — Telehealth: Payer: Self-pay | Admitting: Internal Medicine

## 2022-02-01 MED ORDER — AMLODIPINE BESYLATE 5 MG PO TABS: 5.0000 mg | ORAL_TABLET | Freq: Every day | ORAL | 3 refills | Status: DC

## 2022-02-01 NOTE — Telephone Encounter (Signed)
Notified pt will leave form up front for him to pick-up. [facility] been sent [facility]

## 2022-02-01 NOTE — Telephone Encounter (Signed)
Patient called requesting a refill on his medication amLODipine (NORVASC) 5 MG tablet. Sent to the pharmacy  [id number] [place], [place]- [address]  . Patient also is requesting a copy of a form that Dr. PCamila Lifilled out for him a couple of days ago. 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 Best call back number for patient is [phone]

## 2022-02-05 ENCOUNTER — Encounter: Payer: Self-pay | Admitting: Internal Medicine

## 2022-02-05 ENCOUNTER — Ambulatory Visit (INDEPENDENT_AMBULATORY_CARE_PROVIDER_SITE_OTHER): Payer: 59 | Admitting: Internal Medicine

## 2022-02-05 VITALS — BP 112/70 | HR 75 | Temp 98.3°F | Ht 72.0 in | Wt 236.0 lb

## 2022-02-05 DIAGNOSIS — Z9889 Other specified postprocedural states: Secondary | ICD-10-CM | POA: Diagnosis not present

## 2022-02-05 DIAGNOSIS — M5126 Other intervertebral disc displacement, lumbar region: Secondary | ICD-10-CM

## 2022-02-05 DIAGNOSIS — I10 Essential (primary) hypertension: Secondary | ICD-10-CM

## 2022-02-05 DIAGNOSIS — Z0000 Encounter for general adult medical examination without abnormal findings: Secondary | ICD-10-CM | POA: Diagnosis not present

## 2022-02-05 NOTE — Assessment & Plan Note (Signed)
F/u w/Dr Simeon Craft

## 2022-02-05 NOTE — Assessment & Plan Note (Signed)
S/p surgery Dec 2022   The symptoms are worse.  There is proximal left leg weakness - clinically a at L1-L2 level.  Start physical therapy.  FMLA 4d/mo; drive to [employer] Start PT NS f/u

## 2022-02-05 NOTE — Assessment & Plan Note (Addendum)
We discussed age appropriate health related issues, including available/recomended screening tests and vaccinations. Labs were ordered to be later reviewed . All questions were answered. We discussed one or more of the following - seat belt use, use of sunscreen/sun exposure exercise, fall risk reduction, second hand smoke exposure, firearm use and storage, seat belt use, a need for adhering to healthy diet and exercise. Labs were ordered.  All questions were answered. Pt declined tDAP CM had a nl colon 2014 Dr Lyndel Safe Shingrix discussed CT ca scoring discussed 12/19

## 2022-02-05 NOTE — Progress Notes (Signed)
Subjective:  Patient ID: Andrew Rowland, male    DOB: 07/01/1960  Age: 62 y.o. MRN: [id number]  CC: No chief complaint on file.   HPI Andrew Rowland presents for a well exam  Outpatient Medications Prior to Visit  Medication Sig Dispense Refill   amLODipine (NORVASC) 5 MG tablet Take 1 tablet (5 mg total) by mouth daily. 90 tablet 3   finasteride (PROSCAR) 5 MG tablet TAKE 1 TABLET (5 MG TOTAL) BY MOUTH DAILY. 90 tablet 3   olmesartan (BENICAR) 20 MG tablet TAKE 1 TABLET BY MOUTH EVERY DAY 90 tablet 3   pantoprazole (PROTONIX) 40 MG tablet TAKE 1 TABLET BY MOUTH EVERY DAY 90 tablet 1   No facility-administered medications prior to visit.    ROS: Review of Systems  Constitutional:  Negative for appetite change, fatigue and unexpected weight change.  HENT:  Negative for congestion, nosebleeds, sneezing, sore throat and trouble swallowing.   Eyes:  Negative for itching and visual disturbance.  Respiratory:  Negative for cough.   Cardiovascular:  Negative for chest pain, palpitations and leg swelling.  Gastrointestinal:  Negative for abdominal distention, blood in stool, diarrhea and nausea.  Genitourinary:  Negative for frequency and hematuria.  Musculoskeletal:  Positive for gait problem. Negative for back pain, joint swelling and neck pain.  Skin:  Negative for rash.  Neurological:  Negative for dizziness, tremors, speech difficulty and weakness.  Psychiatric/Behavioral:  Negative for agitation, dysphoric mood and sleep disturbance. The patient is not nervous/anxious.     Objective:  BP 112/70 (BP Location: Right Arm, Patient Position: Sitting, Cuff Size: Normal)   Pulse 75   Temp 98.3 F (36.8 C) (Oral)   Ht 6' (1.829 m)   Wt 236 lb (107 kg)   SpO2 96%   BMI 32.01 kg/m   BP Readings from Last 3 Encounters:  02/05/22 112/70  01/18/22 110/62  11/14/21 110/70    Wt Readings from Last 3 Encounters:  02/05/22 236 lb (107 kg)  01/18/22 231 lb 6.4 oz (105 kg)   11/14/21 230 lb 6.4 oz (104.5 kg)    Physical Exam Constitutional:      General: He is not in acute distress.    Appearance: He is well-developed.     Comments: NAD  Eyes:     Conjunctiva/sclera: Conjunctivae normal.     Pupils: Pupils are equal, round, and reactive to light.  Neck:     Thyroid: No thyromegaly.     Vascular: No JVD.  Cardiovascular:     Rate and Rhythm: Normal rate and regular rhythm.     Heart sounds: Normal heart sounds. No murmur heard.    No friction rub. No gallop.  Pulmonary:     Effort: Pulmonary effort is normal. No respiratory distress.     Breath sounds: Normal breath sounds. No wheezing or rales.  Chest:     Chest wall: No tenderness.  Abdominal:     General: Bowel sounds are normal. There is no distension.     Palpations: Abdomen is soft. There is no mass.     Tenderness: There is no abdominal tenderness. There is no guarding or rebound.  Musculoskeletal:        General: No tenderness. Normal range of motion.     Cervical back: Normal range of motion.  Lymphadenopathy:     Cervical: No cervical adenopathy.  Skin:    General: Skin is warm and dry.     Findings: No rash.  Neurological:  Mental Status: He is alert and oriented to person, place, and time.     Cranial Nerves: No cranial nerve deficit.     Motor: No abnormal muscle tone.     Coordination: Coordination normal.     Gait: Gait normal.     Deep Tendon Reflexes: Reflexes are normal and symmetric.  Psychiatric:        Behavior: Behavior normal.        Thought Content: Thought content normal.        Judgment: Judgment normal.     Lab Results  Component Value Date   WBC 6.1 01/18/2022   HGB 15.3 01/18/2022   HCT 45.9 01/18/2022   PLT 325.0 01/18/2022   GLUCOSE 93 01/18/2022   CHOL 175 01/18/2022   TRIG 119.0 01/18/2022   HDL 39.70 01/18/2022   LDLCALC 112 (H) 01/18/2022   ALT 16 01/18/2022   AST 18 01/18/2022   NA 140 01/18/2022   K 4.9 01/18/2022   CL 104 01/18/2022    CREATININE 1.09 01/18/2022   BUN 15 01/18/2022   CO2 29 01/18/2022   TSH 2.11 01/18/2022   PSA 0.94 01/18/2022    No results found.  Assessment & Plan:   Problem List Items Addressed This Visit       Cardiovascular and Mediastinum   HTN (hypertension)    Will check lipids, UA, and other labs        Musculoskeletal and Integument   Lumbar herniated disc    S/p surgery Dec 2022   The symptoms are worse.  There is proximal left leg weakness - clinically a at L1-L2 level.  Start physical therapy.  FMLA 4d/mo; drive to [employer] Start PT NS f/u        Other   S/P lumbar laminectomy     F/u w/Dr Simeon Craft      Well adult exam - Primary    We discussed age appropriate health related issues, including available/recomended screening tests and vaccinations. Labs were ordered to be later reviewed . All questions were answered. We discussed one or more of the following - seat belt use, use of sunscreen/sun exposure exercise, fall risk reduction, second hand smoke exposure, firearm use and storage, seat belt use, a need for adhering to healthy diet and exercise. Labs were ordered.  All questions were answered. Pt declined tDAP CM had a nl colon 2014 Dr Lyndel Safe Shingrix discussed CT ca scoring discussed 12/19         No orders of the defined types were placed in this encounter.     Follow-up: Return in about 6 months (around 08/06/2022) for a follow-up visit.  Walker Kehr, MD

## 2022-02-11 NOTE — Assessment & Plan Note (Signed)
Will check lipids, UA, and other labs

## 2022-02-19 NOTE — Therapy (Unsigned)
OUTPATIENT PHYSICAL THERAPY THORACOLUMBAR EVALUATION   Patient Name: Andrew Rowland MRN: [id number] DOB:1960/01/24, 62 y.o., male Today's Date: 02/20/2022  END OF SESSION:  PT End of Session - 02/20/22 0804     Visit Number 1    Number of Visits 12    Date for PT Re-Evaluation 04/03/22    PT Start Time 0805    PT Stop Time 0851    PT Time Calculation (min) 46 min    Activity Tolerance Patient tolerated treatment well             Past Medical History:  Diagnosis Date   GERD (gastroesophageal reflux disease)    Hypertension    Insomnia    Vitamin B12 deficiency    Vitamin D deficiency    Past Surgical History:  Procedure Laterality Date   BACK SURGERY     COLONOSCOPY  06/13/2012   Mild sigmoid diverticulosis. Small internal hemorrhoids. Otherwise normal colonoscopy to terminal ileum   Patient Active Problem List   Diagnosis Date Noted   OSA (obstructive sleep apnea) 10/25/2021   S/P lumbar laminectomy 01/19/2021   Shoulder pain 01/03/2021   Contact dermatitis 01/03/2021   Radiculopathy of lumbar region 01/02/2021   Lumbar herniated disc 12/15/2020   UTI (urinary tract infection) 04/28/2020   GERD (gastroesophageal reflux disease) 02/02/2020   Trigger thumb 02/02/2020   Edema 08/27/2019   Elevated PSA 01/06/2018   Cough 01/02/2018   B12 deficiency 07/16/2014   Vitamin D deficiency 07/16/2014   BPH (benign prostatic hyperplasia) 07/16/2014   Exercise-induced asthma 05/11/2013   Asthmatic bronchitis 01/22/2013   Left otitis media 01/22/2013   Well adult exam 05/09/2012   Venous insufficiency 05/02/2011   Right ankle swelling 04/04/2011   Headache(784.0) 08/09/2010   HTN (hypertension) 01/19/2009   TOBACCO USE, QUIT 01/19/2009    PCP:  Cassandria Anger, MD  REFERRING PROVIDER: Cassandria Anger, MD  REFERRING DIAG: M54.16 (ICD-10-CM) - Lumbar radiculopathy  Rationale for Evaluation and Treatment: Rehabilitation  THERAPY DIAG:  Other low  back pain - Plan: PT plan of care cert/re-cert  Muscle weakness (generalized) - Plan: PT plan of care cert/re-cert  ONSET DATE: since 11/22  SUBJECTIVE:                                                                                                                                                                                           SUBJECTIVE STATEMENT: States that November of 2022 he had back pain and spasms at time. States he [place] a [place] to [place]. States he was doing a lot and lifting a lot, had a fall with biking but  overall he felt like a spasm was building up in his back. States that he couldn't get up and couldn't walk so he had surgery. States he had home PT and reports he was slow in recovering. States he walked a long in January and February. States that he was walking 2.5 miles and his back started hurting again. States that he was instructed to take ibuprofen for long walks. States he kept a slow pace with walking. States he was walking and biking. States he does a one mile walk and at about 3 mph. States he feels a little better with slpower speed. States tht he wanted to lose weight. States that he started running on treadmill and he started hurting again. States he started slow walking and it still hurts. States that he still gets numbness in his left leg after 5 minutes of sititng and this is since prior to the surgery. States that he has numbness on the inside of his left foot but that is since surgery.  Wants to get back to walking, hates walking on treadmill. States that he tried therapy and it helped a little bit but still had the numbness When he works out everyday he feels good but he used to not have to do this.  States he has to sit for work 8 hrs a/day and has to drive currently only driving to 1day a week to [place] but was driving 3 days a week. Incline walking is worse if he holds his low back it feels better.   PERTINENT HISTORY:  Lumbar discectomy/laminectomy L3/4  12/16/20  PAIN:  Are you having pain? Yes: NPRS scale: no number given /10 Pain location: numbness in left leg  and left lower back  Pain description: numbness  Aggravating factors: sitting, incline walking Relieving factors: rest  PRECAUTIONS: None  WEIGHT BEARING RESTRICTIONS: No  FALLS:  Has patient fallen in last 6 months? No    OCCUPATION: works from home- sits most of the day  PLOF: Independent  PATIENT GOALS: to be able to walk faster and up hill without pain    OBJECTIVE:   SCREENING FOR RED FLAGS: Bowel or bladder incontinence: No Spinal tumors: No Cauda equina syndrome: No Compression fracture: No Abdominal aneurysm: No  COGNITION: Overall cognitive status: Within functional limits for tasks assessed      POSTURE: rounded shoulders, posterior pelvic tilt, and flexed trunk   PALPATION: Tendernss to palpation along B lumbar paraspinals, glute meds and QL  LUMBAR ROM:   AROM eval  Flexion 75  Extension 75  Right lateral flexion 50  Left lateral flexion 50  Right rotation   Left rotation    (Blank rows = not tested)    LE Measurements Lower Extremity Right 02/20/2022 Left 02/20/2022   A/PROM MMT A/PROM MMT  Hip Flexion  3+  3+*  Hip Extension  4  3+*  Hip Abduction      Hip Adduction      Hip Internal rotation 30  20   Hip External rotation 60  50   Knee Flexion  4  4-  Knee Extension  4  3+*  Ankle Dorsiflexion  5  3+  Ankle Plantarflexion      Ankle Inversion      Ankle Eversion       (Blank rows = not tested) * pain   LUMBAR SPECIAL TESTS:  Slump test - but more restricted in mobility on left compared to right Ely's test pos B  TODAY'S TREATMENT:                                                                                                                              DATE:   02/20/2022  Therapeutic Exercise:  Aerobic: Supine: Prone: hamstring curls x15 5" holds B - cramping initially, prone press ups x15 5" holds cues  to not squeeze butt   Seated:  Standing: Neuromuscular Re-education: Manual Therapy: Therapeutic Activity: Self Care: Trigger Point Dry Needling:  Modalities:    PATIENT EDUCATION:  Education details: on current presentation, on HEP, on clinical outcomes score and POC, on ergonomic set up with proper height chair and lumbar roll, on why cramps happen and how to address them  Person educated: Patient Education method: Explanation, Demonstration, and Handouts Education comprehension: verbalized understanding   HOME EXERCISE PROGRAM: [id number]  ASSESSMENT:  CLINICAL IMPRESSION: Patient presents with chronic low back pain with low back surgery in 12/16/20. Patient with residual left lower foot numbness since the surgery and weakness/pain in left lower leg since prior to the surgery. Patient used ot be very active and wants to lose weight, but every time he exerts himself (increases walking speed or runs) he has increased pain. He would like to return to fast paced walking and prior level of function without debilitating pain. Patient demonstrates weakness, cramping and ROM restrictions that are likely contributing to current condition and would greatly benefit from skilled PT to improve overall function and QOL.   OBJECTIVE IMPAIRMENTS: decreased activity tolerance, decreased balance, decreased mobility, difficulty walking, decreased ROM, decreased strength, improper body mechanics, postural dysfunction, and pain.   ACTIVITY LIMITATIONS: carrying, lifting, bending, sitting, standing, and locomotion level  PARTICIPATION LIMITATIONS: driving and community activity  PERSONAL FACTORS: Age, Fitness, and 1 comorbidity: hx of lumbar surgery in 2022  are also affecting patient's functional outcome.   REHAB POTENTIAL: Good  CLINICAL DECISION MAKING: Stable/uncomplicated  EVALUATION COMPLEXITY: Low   GOALS: Goals reviewed with patient? yes  SHORT TERM GOALS: Target date: 03/13/2022   Patient will be independent in self management strategies to improve quality of life and functional outcomes. Baseline: New Program Goal status: INITIAL  2.  Patient will report at least 25% improvement in overall symptoms and/or function to demonstrate improved functional mobility Baseline: 0% better Goal status: INITIAL  3.  Patient will be able to walk up hill around neighborhood without pain or difficulty to improve overall mobility. Baseline: painful Goal status: INITIAL  4.  Patient will be able to demonstrate hip hinge with good form to improve bending mechanics.  Baseline: unable  Goal status: INITIAL    LONG TERM GOALS: Target date: 04/03/2022   Patient will report at least 50% improvement in overall symptoms and/or function to demonstrate improved functional mobility Baseline: 0% better Goal status: INITIAL  2. Patient will report using good ergonomic set up at home and standing at least 1x every 30 minutes to reduce stress on lumbar spine.  Baseline: not getting up or using set up regularly Goal status: INITIAL  3.  Patient will be able to demonstrate at least 4/5 MMT in B LE  Baseline: see above Goal status: INITIAL  4.  Patient will be able to demonstrate good lifting mechanics to reduce risk of injury to low back  Baseline:  Goal status: INITIAL   PLAN:  PT FREQUENCY: 2x/week  PT DURATION: 6 weeks  PLANNED INTERVENTIONS: Therapeutic exercises, Therapeutic activity, Neuromuscular re-education, Balance training, Gait training, Patient/Family education, Self Care, Joint mobilization, Joint manipulation, Orthotic/Fit training, Aquatic Therapy, Dry Needling, Electrical stimulation, Spinal manipulation, Spinal mobilization, Cryotherapy, Moist heat, Vasopneumatic device, Traction, Ionotophoresis '4mg'$ /ml Dexamethasone, Manual therapy, and Re-evaluation.  PLAN FOR NEXT SESSION: pelvic tilts, post chain activation, lumbar ROM, core activation breathing, hip hinge,  bridges   10:49 AM, 02/20/22 Jerene Pitch, DPT Physical Therapy with [facility]

## 2022-02-20 ENCOUNTER — Ambulatory Visit (INDEPENDENT_AMBULATORY_CARE_PROVIDER_SITE_OTHER): Payer: 59 | Admitting: Physical Therapy

## 2022-02-20 ENCOUNTER — Encounter: Payer: Self-pay | Admitting: Physical Therapy

## 2022-02-20 DIAGNOSIS — M5459 Other low back pain: Secondary | ICD-10-CM

## 2022-02-20 DIAGNOSIS — M6281 Muscle weakness (generalized): Secondary | ICD-10-CM | POA: Diagnosis not present

## 2022-02-21 NOTE — Therapy (Unsigned)
OUTPATIENT PHYSICAL THERAPY TREATMENT NOTE   Patient Name: Andrew Rowland MRN: [id number] DOB:1960-03-12, 62 y.o., male Today's Date: 02/22/2022  PCP:   Cassandria Anger, MD   REFERRING PROVIDER: Cassandria Anger, MD  END OF SESSION:   PT End of Session - 02/22/22 1429     Visit Number 2    Number of Visits 12    Date for PT Re-Evaluation 04/03/22    PT Start Time 1432    PT Stop Time 1511    PT Time Calculation (min) 39 min    Activity Tolerance Patient tolerated treatment well             Past Medical History:  Diagnosis Date   GERD (gastroesophageal reflux disease)    Hypertension    Insomnia    Vitamin B12 deficiency    Vitamin D deficiency    Past Surgical History:  Procedure Laterality Date   BACK SURGERY     COLONOSCOPY  06/13/2012   Mild sigmoid diverticulosis. Small internal hemorrhoids. Otherwise normal colonoscopy to terminal ileum   Patient Active Problem List   Diagnosis Date Noted   OSA (obstructive sleep apnea) 10/25/2021   S/P lumbar laminectomy 01/19/2021   Shoulder pain 01/03/2021   Contact dermatitis 01/03/2021   Radiculopathy of lumbar region 01/02/2021   Lumbar herniated disc 12/15/2020   UTI (urinary tract infection) 04/28/2020   GERD (gastroesophageal reflux disease) 02/02/2020   Trigger thumb 02/02/2020   Edema 08/27/2019   Elevated PSA 01/06/2018   Cough 01/02/2018   B12 deficiency 07/16/2014   Vitamin D deficiency 07/16/2014   BPH (benign prostatic hyperplasia) 07/16/2014   Exercise-induced asthma 05/11/2013   Asthmatic bronchitis 01/22/2013   Left otitis media 01/22/2013   Well adult exam 05/09/2012   Venous insufficiency 05/02/2011   Right ankle swelling 04/04/2011   Headache(784.0) 08/09/2010   HTN (hypertension) 01/19/2009   TOBACCO USE, QUIT 01/19/2009     THERAPY DIAG:  Other low back pain  Muscle weakness (generalized)    REFERRING DIAG: M54.16 (ICD-10-CM) - Lumbar radiculopathy   Rationale for  Evaluation and Treatment: Rehabilitation    ONSET DATE: since 11/22   SUBJECTIVE:                                                                                                                                                                                            SUBJECTIVE STATEMENT: 02/22/2022 States that he feels good today and has no pain and has been walking a lot. States that he has been doing his exercises and he is no longer having cramping with the exercise.   Eval: States  that November of 2022 he had back pain and spasms at time. States he [place] a [place]. States he was doing a lot and lifting a lot, had a fall with biking but overall he felt like a spasm was building up in his back. States that he couldn't get up and couldn't walk so he had surgery. States he had home PT and reports he was slow in recovering. States he walked a long in January and February. States that he was walking 2.5 miles and his back started hurting again. States that he was instructed to take ibuprofen for long walks. States he kept a slow pace with walking. States he was walking and biking. States he does a one mile walk and at about 3 mph. States he feels a little better with slpower speed. States tht he wanted to lose weight. States that he started running on treadmill and he started hurting again. States he started slow walking and it still hurts. States that he still gets numbness in his left leg after 5 minutes of sititng and this is since prior to the surgery. States that he has numbness on the inside of his left foot but that is since surgery.   Wants to get back to walking, hates walking on treadmill. States that he tried therapy and it helped a little bit but still had the numbness When he works out everyday he feels good but he used to not have to do this.  States he has to sit for work 8 hrs a/day and has to drive currently only driving to 1day a week to [place] but was driving 3 days a week. Incline  walking is worse if he holds his low back it feels better.    PERTINENT HISTORY:  Lumbar discectomy/laminectomy L3/4 12/16/20   PAIN:  Are you having pain? no: NPRS scale: no number given /10 Pain location: numbness in left leg  and left lower back  Pain description: numbness  Aggravating factors: sitting, incline walking Relieving factors: rest   PRECAUTIONS: None   WEIGHT BEARING RESTRICTIONS: No   FALLS:  Has patient fallen in last 6 months? No       OCCUPATION: works from home- sits most of the day   PLOF: Independent   PATIENT GOALS: to be able to walk faster and up hill without pain       OBJECTIVE:    SCREENING FOR RED FLAGS: Bowel or bladder incontinence: No Spinal tumors: No Cauda equina syndrome: No Compression fracture: No Abdominal aneurysm: No   COGNITION: Overall cognitive status: Within functional limits for tasks assessed                     POSTURE: rounded shoulders, posterior pelvic tilt, and flexed trunk    PALPATION: Tendernss to palpation along B lumbar paraspinals, glute meds and QL   LUMBAR ROM:    AROM eval  Flexion 75  Extension 75  Right lateral flexion 50  Left lateral flexion 50  Right rotation    Left rotation     (Blank rows = not tested)       LE Measurements       Lower Extremity Right 02/20/2022 Left 02/20/2022    A/PROM MMT A/PROM MMT  Hip Flexion   3+   3+*  Hip Extension   4   3+*  Hip Abduction          Hip Adduction  Hip Internal rotation 30   20    Hip External rotation 60   50    Knee Flexion   4   4-  Knee Extension   4   3+*  Ankle Dorsiflexion   5   3+  Ankle Plantarflexion          Ankle Inversion          Ankle Eversion           (Blank rows = not tested) * pain     LUMBAR SPECIAL TESTS:  Slump test - but more restricted in mobility on left compared to right Ely's test pos B        TODAY'S TREATMENT:                                                                                                                               DATE:   02/22/2022   Therapeutic Exercise:    Aerobic: Supine: thomas stretch x4 30" holds B Prone:     Seated:    Standing: hamstring curls x20 5" holds at counter Neuromuscular Re-education: lower rib depression/approximation with long exhale in hook lying 15 minutes  Manual Therapy: STM to left quad with percussion gun - tolerated well - and reduced cramping in thigh noted  Therapeutic Activity: Self Care: Trigger Point Dry Needling:  Modalities:      PATIENT EDUCATION:  Education details: on HEP Person educated: Patient Education method: Consulting civil engineer, Demonstration, and Handouts Education comprehension: verbalized understanding     HOME EXERCISE PROGRAM: [id number]   ASSESSMENT:   CLINICAL IMPRESSION: 02/22/2022 Session focused on progression of exercises as well as education on importance of movements to combat sitting all day. Reviewed Hamstring curls that patient was doing previously while standing and mostly performed with hip flexion. Cued for hip neutral and initial cramping in hamstring but this resolved with practice. No pain noted end of session. Will contienu with current POC.  Eval: Patient presents with chronic low back pain with low back surgery in 12/16/20. Patient with residual left lower foot numbness since the surgery and weakness/pain in left lower leg since prior to the surgery. Patient used ot be very active and wants to lose weight, but every time he exerts himself (increases walking speed or runs) he has increased pain. He would like to return to fast paced walking and prior level of function without debilitating pain. Patient demonstrates weakness, cramping and ROM restrictions that are likely contributing to current condition and would greatly benefit from skilled PT to improve overall function and QOL.    OBJECTIVE IMPAIRMENTS: decreased activity tolerance, decreased balance, decreased mobility, difficulty walking,  decreased ROM, decreased strength, improper body mechanics, postural dysfunction, and pain.    ACTIVITY LIMITATIONS: carrying, lifting, bending, sitting, standing, and locomotion level   PARTICIPATION LIMITATIONS: driving and community activity   PERSONAL FACTORS: Age, Fitness, and 1 comorbidity: hx of lumbar surgery in 2022  are also  affecting patient's functional outcome.    REHAB POTENTIAL: Good   CLINICAL DECISION MAKING: Stable/uncomplicated   EVALUATION COMPLEXITY: Low     GOALS: Goals reviewed with patient? yes   SHORT TERM GOALS: Target date: 03/13/2022  Patient will be independent in self management strategies to improve quality of life and functional outcomes. Baseline: New Program Goal status: INITIAL   2.  Patient will report at least 25% improvement in overall symptoms and/or function to demonstrate improved functional mobility Baseline: 0% better Goal status: INITIAL   3.  Patient will be able to walk up hill around neighborhood without pain or difficulty to improve overall mobility. Baseline: painful Goal status: INITIAL   4.  Patient will be able to demonstrate hip hinge with good form to improve bending mechanics.  Baseline: unable  Goal status: INITIAL       LONG TERM GOALS: Target date: 04/03/2022    Patient will report at least 50% improvement in overall symptoms and/or function to demonstrate improved functional mobility Baseline: 0% better Goal status: INITIAL   2. Patient will report using good ergonomic set up at home and standing at least 1x every 30 minutes to reduce stress on lumbar spine.  Baseline: not getting up or using set up regularly Goal status: INITIAL   3.  Patient will be able to demonstrate at least 4/5 MMT in B LE  Baseline: see above Goal status: INITIAL   4.  Patient will be able to demonstrate good lifting mechanics to reduce risk of injury to low back  Baseline:  Goal status: INITIAL     PLAN:   PT FREQUENCY:  2x/week   PT DURATION: 6 weeks   PLANNED INTERVENTIONS: Therapeutic exercises, Therapeutic activity, Neuromuscular re-education, Balance training, Gait training, Patient/Family education, Self Care, Joint mobilization, Joint manipulation, Orthotic/Fit training, Aquatic Therapy, Dry Needling, Electrical stimulation, Spinal manipulation, Spinal mobilization, Cryotherapy, Moist heat, Vasopneumatic device, Traction, Ionotophoresis '4mg'$ /ml Dexamethasone, Manual therapy, and Re-evaluation.   PLAN FOR NEXT SESSION: pelvic tilts, post chain activation, lumbar ROM, core activation breathing, hip hinge, bridges     3:19 PM, 02/22/22 Jerene Pitch, DPT Physical Therapy [facility]

## 2022-02-22 ENCOUNTER — Ambulatory Visit (INDEPENDENT_AMBULATORY_CARE_PROVIDER_SITE_OTHER): Payer: 59 | Admitting: Physical Therapy

## 2022-02-22 ENCOUNTER — Encounter: Payer: Self-pay | Admitting: Physical Therapy

## 2022-02-22 DIAGNOSIS — M6281 Muscle weakness (generalized): Secondary | ICD-10-CM | POA: Diagnosis not present

## 2022-02-22 DIAGNOSIS — M5459 Other low back pain: Secondary | ICD-10-CM

## 2022-02-26 ENCOUNTER — Encounter: Payer: Self-pay | Admitting: Physical Therapy

## 2022-02-26 ENCOUNTER — Ambulatory Visit (INDEPENDENT_AMBULATORY_CARE_PROVIDER_SITE_OTHER): Payer: 59 | Admitting: Physical Therapy

## 2022-02-26 DIAGNOSIS — M6281 Muscle weakness (generalized): Secondary | ICD-10-CM

## 2022-02-26 DIAGNOSIS — M5459 Other low back pain: Secondary | ICD-10-CM | POA: Diagnosis not present

## 2022-02-26 NOTE — Therapy (Signed)
OUTPATIENT PHYSICAL THERAPY TREATMENT NOTE   Patient Name: Andrew Rowland MRN: [id number] DOB:1960-02-26, 62 y.o., male Today's Date: 02/26/2022  PCP:   Cassandria Anger, MD   REFERRING PROVIDER: Cassandria Anger, MD  END OF SESSION:   PT End of Session - 02/26/22 1436     Visit Number 3    Number of Visits 12    Date for PT Re-Evaluation 04/03/22    PT Start Time 1436    PT Stop Time [id number]    PT Time Calculation (min) 38 min    Activity Tolerance Patient tolerated treatment well             Past Medical History:  Diagnosis Date   GERD (gastroesophageal reflux disease)    Hypertension    Insomnia    Vitamin B12 deficiency    Vitamin D deficiency    Past Surgical History:  Procedure Laterality Date   BACK SURGERY     COLONOSCOPY  06/13/2012   Mild sigmoid diverticulosis. Small internal hemorrhoids. Otherwise normal colonoscopy to terminal ileum   Patient Active Problem List   Diagnosis Date Noted   OSA (obstructive sleep apnea) 10/25/2021   S/P lumbar laminectomy 01/19/2021   Shoulder pain 01/03/2021   Contact dermatitis 01/03/2021   Radiculopathy of lumbar region 01/02/2021   Lumbar herniated disc 12/15/2020   UTI (urinary tract infection) 04/28/2020   GERD (gastroesophageal reflux disease) 02/02/2020   Trigger thumb 02/02/2020   Edema 08/27/2019   Elevated PSA 01/06/2018   Cough 01/02/2018   B12 deficiency 07/16/2014   Vitamin D deficiency 07/16/2014   BPH (benign prostatic hyperplasia) 07/16/2014   Exercise-induced asthma 05/11/2013   Asthmatic bronchitis 01/22/2013   Left otitis media 01/22/2013   Well adult exam 05/09/2012   Venous insufficiency 05/02/2011   Right ankle swelling 04/04/2011   Headache(784.0) 08/09/2010   HTN (hypertension) 01/19/2009   TOBACCO USE, QUIT 01/19/2009     THERAPY DIAG:  Other low back pain  Muscle weakness (generalized)    REFERRING DIAG: M54.16 (ICD-10-CM) - Lumbar radiculopathy   Rationale for  Evaluation and Treatment: Rehabilitation    ONSET DATE: since 11/22   SUBJECTIVE:                                                                                                                                                                                            SUBJECTIVE STATEMENT: 02/26/2022 States that he is feeling good and did 4.5 miles yesterday and didn't hurt and is  a little stiff after. States that   Eval: States that November of 2022 he had back pain and spasms at  time. States he drives a lot to [place]. States he was doing a lot and lifting a lot, had a fall with biking but overall he felt like a spasm was building up in his back. States that he couldn't get up and couldn't walk so he had surgery. States he had home PT and reports he was slow in recovering. States he walked a long in January and February. States that he was walking 2.5 miles and his back started hurting again. States that he was instructed to take ibuprofen for long walks. States he kept a slow pace with walking. States he was walking and biking. States he does a one mile walk and at about 3 mph. States he feels a little better with slpower speed. States tht he wanted to lose weight. States that he started running on treadmill and he started hurting again. States he started slow walking and it still hurts. States that he still gets numbness in his left leg after 5 minutes of sititng and this is since prior to the surgery. States that he has numbness on the inside of his left foot but that is since surgery.   Wants to get back to walking, hates walking on treadmill. States that he tried therapy and it helped a little bit but still had the numbness When he works out everyday he feels good but he used to not have to do this.  States he has to sit for work 8 hrs a/day and has to drive currently only driving to 1day a week to [place] but was driving 3 days a week. Incline walking is worse if he holds his low back it feels better.     PERTINENT HISTORY:  Lumbar discectomy/laminectomy L3/4 12/16/20   PAIN:  Are you having pain? no: NPRS scale: no number given /10 Pain location: numbness in left leg  and left lower back  Pain description: numbness  Aggravating factors: sitting, incline walking Relieving factors: rest   PRECAUTIONS: None   WEIGHT BEARING RESTRICTIONS: No   FALLS:  Has patient fallen in last 6 months? No       OCCUPATION: works from home- sits most of the day   PLOF: Independent   PATIENT GOALS: to be able to walk faster and up hill without pain       OBJECTIVE:    SCREENING FOR RED FLAGS: Bowel or bladder incontinence: No Spinal tumors: No Cauda equina syndrome: No Compression fracture: No Abdominal aneurysm: No   COGNITION: Overall cognitive status: Within functional limits for tasks assessed                     POSTURE: rounded shoulders, posterior pelvic tilt, and flexed trunk    PALPATION: Tendernss to palpation along B lumbar paraspinals, glute meds and QL   LUMBAR ROM:    AROM eval  Flexion 75  Extension 75  Right lateral flexion 50  Left lateral flexion 50  Right rotation    Left rotation     (Blank rows = not tested)       LE Measurements       Lower Extremity Right 02/20/2022 Left 02/20/2022    A/PROM MMT A/PROM MMT  Hip Flexion   3+   3+*  Hip Extension   4   3+*  Hip Abduction          Hip Adduction          Hip Internal rotation 30   20  Hip External rotation 60   50    Knee Flexion   4   4-  Knee Extension   4   3+*  Ankle Dorsiflexion   5   3+  Ankle Plantarflexion          Ankle Inversion          Ankle Eversion           (Blank rows = not tested) * pain     LUMBAR SPECIAL TESTS:  Slump test - but more restricted in mobility on left compared to right Ely's test pos B        TODAY'S TREATMENT:                                                                                                                              DATE:    02/26/2022   Therapeutic Exercise:    Aerobic: Supine:  Prone: hamstring curl x10 10" holds, prone hip extension knee bent 4x5 5", heel press x20 5" holds B, child's pose x5 10" holds, cat/cow x15 5" holds    Seated: hip flexor stretch 2x5 15" holds B    Standing: hamstring curls x20 5" holds at counter Neuromuscular Re-education:  hip hinge 8 minutes - table and verbal tactiles cues Therapeutic Activity: Self Care: Trigger Point Dry Needling:  Modalities:      PATIENT EDUCATION:  Education details: on HEP Person educated: Patient Education method: Explanation, Demonstration, and Handouts Education comprehension: verbalized understanding     HOME EXERCISE PROGRAM: [id number]   ASSESSMENT:   CLINICAL IMPRESSION: 02/26/2022 Added additional exercises and reviewed HEP. Answered all questions. Patient continues to have excessive lumbar motions with hip extension and rotation. Trailed hip hinge and this was difficult for patient. Will try this again next session. Changed hip flexor stretch to improve adherence. Will continue with current POC as tolerated.   Eval: Patient presents with chronic low back pain with low back surgery in 12/16/20. Patient with residual left lower foot numbness since the surgery and weakness/pain in left lower leg since prior to the surgery. Patient used ot be very active and wants to lose weight, but every time he exerts himself (increases walking speed or runs) he has increased pain. He would like to return to fast paced walking and prior level of function without debilitating pain. Patient demonstrates weakness, cramping and ROM restrictions that are likely contributing to current condition and would greatly benefit from skilled PT to improve overall function and QOL.    OBJECTIVE IMPAIRMENTS: decreased activity tolerance, decreased balance, decreased mobility, difficulty walking, decreased ROM, decreased strength, improper body mechanics, postural dysfunction,  and pain.    ACTIVITY LIMITATIONS: carrying, lifting, bending, sitting, standing, and locomotion level   PARTICIPATION LIMITATIONS: driving and community activity   PERSONAL FACTORS: Age, Fitness, and 1 comorbidity: hx of lumbar surgery in 2022  are also affecting patient's functional outcome.    REHAB POTENTIAL: Good  CLINICAL DECISION MAKING: Stable/uncomplicated   EVALUATION COMPLEXITY: Low     GOALS: Goals reviewed with patient? yes   SHORT TERM GOALS: Target date: 03/13/2022  Patient will be independent in self management strategies to improve quality of life and functional outcomes. Baseline: New Program Goal status: INITIAL   2.  Patient will report at least 25% improvement in overall symptoms and/or function to demonstrate improved functional mobility Baseline: 0% better Goal status: INITIAL   3.  Patient will be able to walk up hill around neighborhood without pain or difficulty to improve overall mobility. Baseline: painful Goal status: INITIAL   4.  Patient will be able to demonstrate hip hinge with good form to improve bending mechanics.  Baseline: unable  Goal status: INITIAL       LONG TERM GOALS: Target date: 04/03/2022    Patient will report at least 50% improvement in overall symptoms and/or function to demonstrate improved functional mobility Baseline: 0% better Goal status: INITIAL   2. Patient will report using good ergonomic set up at home and standing at least 1x every 30 minutes to reduce stress on lumbar spine.  Baseline: not getting up or using set up regularly Goal status: INITIAL   3.  Patient will be able to demonstrate at least 4/5 MMT in B LE  Baseline: see above Goal status: INITIAL   4.  Patient will be able to demonstrate good lifting mechanics to reduce risk of injury to low back  Baseline:  Goal status: INITIAL     PLAN:   PT FREQUENCY: 2x/week   PT DURATION: 6 weeks   PLANNED INTERVENTIONS: Therapeutic exercises,  Therapeutic activity, Neuromuscular re-education, Balance training, Gait training, Patient/Family education, Self Care, Joint mobilization, Joint manipulation, Orthotic/Fit training, Aquatic Therapy, Dry Needling, Electrical stimulation, Spinal manipulation, Spinal mobilization, Cryotherapy, Moist heat, Vasopneumatic device, Traction, Ionotophoresis 65m/ml Dexamethasone, Manual therapy, and Re-evaluation.   PLAN FOR NEXT SESSION: pelvic tilts, post chain activation, lumbar ROM, core activation breathing, hip hinge, bridges     3:19 PM, 02/26/22 MJerene Pitch DPT Physical Therapy with [facility]

## 2022-02-27 ENCOUNTER — Telehealth: Payer: Self-pay | Admitting: Internal Medicine

## 2022-02-27 NOTE — Telephone Encounter (Signed)
Called Andrew Rowland back no answer LMOM RTC.Marland KitchenJohny Chess

## 2022-02-27 NOTE — Telephone Encounter (Signed)
Rec'd medical accomodation form pls in MD purple folder to complete.Marland KitchenJohny Chess

## 2022-02-27 NOTE — Telephone Encounter (Signed)
Patient's boss from [employer] called and asked for clarification on his accommodations for working. She said one of the accommodations said he could not drive and she would like to confirm if that means he cannot take any other form of transportation or if he is just not able to drive. She is also faxing over the questions she has to make sure everything is received. Her name is Gertha Calkin and her phone number is [phone]

## 2022-02-28 NOTE — Telephone Encounter (Signed)
Form has been faxed back to [facility] @ [phone].Marland KitchenJohny Rowland

## 2022-02-28 NOTE — Telephone Encounter (Signed)
Signed. Thanks.

## 2022-03-01 ENCOUNTER — Ambulatory Visit (INDEPENDENT_AMBULATORY_CARE_PROVIDER_SITE_OTHER): Payer: 59 | Admitting: Physical Therapy

## 2022-03-01 ENCOUNTER — Encounter: Payer: Self-pay | Admitting: Physical Therapy

## 2022-03-01 DIAGNOSIS — M6281 Muscle weakness (generalized): Secondary | ICD-10-CM

## 2022-03-01 DIAGNOSIS — M5459 Other low back pain: Secondary | ICD-10-CM

## 2022-03-01 NOTE — Therapy (Addendum)
OUTPATIENT PHYSICAL THERAPY TREATMENT NOTE PHYSICAL THERAPY DISCHARGE SUMMARY  Visits from Start of Care: 4  Current functional level related to goals / functional outcomes: Unable to assess due to unplanned discharge    Remaining deficits: Unable to assess due to unplanned discharge    Education / Equipment: Unable to assess due to unplanned discharge    Patient agrees to discharge. Patient goals were not met. Patient is being discharged due to not returning since the last visit.  9:18 AM, 07/10/22 Tereasa Coop, DPT Physical Therapy [facility]    Patient Name: Andrew Rowland MRN: [id number] DOB:Sep 18, 1960, 62 y.o., male Today's Date: 03/01/2022  PCP:   Tresa Garter, MD   REFERRING PROVIDER: Tresa Garter, MD  END OF SESSION:   PT End of Session - 03/01/22 1447     Visit Number 4    Number of Visits 12    Date for PT Re-Evaluation 04/03/22    PT Start Time 1450    PT Stop Time 1528    PT Time Calculation (min) 38 min    Activity Tolerance Patient tolerated treatment well             Past Medical History:  Diagnosis Date   GERD (gastroesophageal reflux disease)    Hypertension    Insomnia    Vitamin B12 deficiency    Vitamin D deficiency    Past Surgical History:  Procedure Laterality Date   BACK SURGERY     COLONOSCOPY  06/13/2012   Mild sigmoid diverticulosis. Small internal hemorrhoids. Otherwise normal colonoscopy to terminal ileum   Patient Active Problem List   Diagnosis Date Noted   OSA (obstructive sleep apnea) 10/25/2021   S/P lumbar laminectomy 01/19/2021   Shoulder pain 01/03/2021   Contact dermatitis 01/03/2021   Radiculopathy of lumbar region 01/02/2021   Lumbar herniated disc 12/15/2020   UTI (urinary tract infection) 04/28/2020   GERD (gastroesophageal reflux disease) 02/02/2020   Trigger thumb 02/02/2020   Edema 08/27/2019   Elevated PSA 01/06/2018   Cough 01/02/2018   B12 deficiency 07/16/2014    Vitamin D deficiency 07/16/2014   BPH (benign prostatic hyperplasia) 07/16/2014   Exercise-induced asthma 05/11/2013   Asthmatic bronchitis 01/22/2013   Left otitis media 01/22/2013   Well adult exam 05/09/2012   Venous insufficiency 05/02/2011   Right ankle swelling 04/04/2011   Headache(784.0) 08/09/2010   HTN (hypertension) 01/19/2009   TOBACCO USE, QUIT 01/19/2009     THERAPY DIAG:  Other low back pain  Muscle weakness (generalized)    REFERRING DIAG: M54.16 (ICD-10-CM) - Lumbar radiculopathy   Rationale for Evaluation and Treatment: Rehabilitation    ONSET DATE: since 11/22   SUBJECTIVE:  SUBJECTIVE STATEMENT: 03/01/2022 States that he has some cramping on the right leg. States he didn't walk for 2 days as he has been taking care of his wife as she has recently had surgery.  Reports he feels tight in his back. States he did his exercises and he feels a little better.   Eval: States that November of 2022 he had back pain and spasms at time. States he [place] a lot to [place]. States he was doing a lot and lifting a lot, had a fall with biking but overall he felt like a spasm was building up in his back. States that he couldn't get up and couldn't walk so he had surgery. States he had home PT and reports he was slow in recovering. States he walked a long in January and February. States that he was walking 2.5 miles and his back started hurting again. States that he was instructed to take ibuprofen for long walks. States he kept a slow pace with walking. States he was walking and biking. States he does a one mile walk and at about 3 mph. States he feels a little better with slpower speed. States tht he wanted to lose weight. States that he started running on treadmill and he started hurting again. States  he started slow walking and it still hurts. States that he still gets numbness in his left leg after 5 minutes of sititng and this is since prior to the surgery. States that he has numbness on the inside of his left foot but that is since surgery.   Wants to get back to walking, hates walking on treadmill. States that he tried therapy and it helped a little bit but still had the numbness When he works out everyday he feels good but he used to not have to do this.  States he has to sit for work 8 hrs a/day and has to drive currently only driving to 1day a week to [place] but was driving 3 days a week. Incline walking is worse if he holds his low back it feels better.    PERTINENT HISTORY:  Lumbar discectomy/laminectomy L3/4 12/16/20   PAIN:  Are you having pain? no: NPRS scale: no number given /10 Pain location: numbness in left leg  and left lower back  Pain description: numbness  Aggravating factors: sitting, incline walking Relieving factors: rest   PRECAUTIONS: None   WEIGHT BEARING RESTRICTIONS: No   FALLS:  Has patient fallen in last 6 months? No       OCCUPATION: works from home- sits most of the day   PLOF: Independent   PATIENT GOALS: to be able to walk faster and up hill without pain       OBJECTIVE:    SCREENING FOR RED FLAGS: Bowel or bladder incontinence: No Spinal tumors: No Cauda equina syndrome: No Compression fracture: No Abdominal aneurysm: No   COGNITION: Overall cognitive status: Within functional limits for tasks assessed                     POSTURE: rounded shoulders, posterior pelvic tilt, and flexed trunk    PALPATION: Tendernss to palpation along B lumbar paraspinals, glute meds and QL   LUMBAR ROM:    AROM eval  Flexion 75  Extension 75  Right lateral flexion 50  Left lateral flexion 50  Right rotation    Left rotation     (Blank rows = not tested)       LE Measurements  Lower Extremity Right 02/20/2022 Left 02/20/2022     A/PROM MMT A/PROM MMT  Hip Flexion   3+   3+*  Hip Extension   4   3+*  Hip Abduction          Hip Adduction          Hip Internal rotation 30   20    Hip External rotation 60   50    Knee Flexion   4   4-  Knee Extension   4   3+*  Ankle Dorsiflexion   5   3+  Ankle Plantarflexion          Ankle Inversion          Ankle Eversion           (Blank rows = not tested) * pain     LUMBAR SPECIAL TESTS:  Slump test - but more restricted in mobility on left compared to right Ely's test pos B        TODAY'S TREATMENT:                                                                                                                              DATE:   03/01/2022   Therapeutic Exercise:    Aerobic: Supine: 90/90 hamstring iso 3 minutes, LTR 3 minutes, hamstring stretch 3 minutes B, lumbar roll in back 3 minutes (small roll)   Neuromuscular Re-education: vacuum core breathing 6 minutes, long exhale seated 6 minutes  Therapeutic Activity: Self Care: Manual therapy: lumbar traction on ball performed by PT 14 minutes  Modalities:      PATIENT EDUCATION:  Education details: on HEP Person educated: Patient Education method: Explanation, Demonstration, and Handouts Education comprehension: verbalized understanding     HOME EXERCISE PROGRAM: [id number]   ASSESSMENT:   CLINICAL IMPRESSION: 03/01/2022 Focused on decompressive exercises and progressing HEP. Tolerated well and reported reduced tension in back. Continued with core activation with breahting and this caused cramping in back but improved ability to activate core after and reduced tension afterwards. Will continue with current POC as tolerated.   Eval: Patient presents with chronic low back pain with low back surgery in 12/16/20. Patient with residual left lower foot numbness since the surgery and weakness/pain in left lower leg since prior to the surgery. Patient used ot be very active and wants to lose weight, but every time he  exerts himself (increases walking speed or runs) he has increased pain. He would like to return to fast paced walking and prior level of function without debilitating pain. Patient demonstrates weakness, cramping and ROM restrictions that are likely contributing to current condition and would greatly benefit from skilled PT to improve overall function and QOL.    OBJECTIVE IMPAIRMENTS: decreased activity tolerance, decreased balance, decreased mobility, difficulty walking, decreased ROM, decreased strength, improper body mechanics, postural dysfunction, and pain.    ACTIVITY LIMITATIONS: carrying, lifting, bending, sitting, standing, and locomotion level  PARTICIPATION LIMITATIONS: driving and community activity   PERSONAL FACTORS: Age, Fitness, and 1 comorbidity: hx of lumbar surgery in 2022  are also affecting patient's functional outcome.    REHAB POTENTIAL: Good   CLINICAL DECISION MAKING: Stable/uncomplicated   EVALUATION COMPLEXITY: Low     GOALS: Goals reviewed with patient? yes   SHORT TERM GOALS: Target date: 03/13/2022  Patient will be independent in self management strategies to improve quality of life and functional outcomes. Baseline: New Program Goal status: INITIAL   2.  Patient will report at least 25% improvement in overall symptoms and/or function to demonstrate improved functional mobility Baseline: 0% better Goal status: INITIAL   3.  Patient will be able to walk up hill around neighborhood without pain or difficulty to improve overall mobility. Baseline: painful Goal status: INITIAL   4.  Patient will be able to demonstrate hip hinge with good form to improve bending mechanics.  Baseline: unable  Goal status: INITIAL       LONG TERM GOALS: Target date: 04/03/2022    Patient will report at least 50% improvement in overall symptoms and/or function to demonstrate improved functional mobility Baseline: 0% better Goal status: INITIAL   2. Patient will  report using good ergonomic set up at home and standing at least 1x every 30 minutes to reduce stress on lumbar spine.  Baseline: not getting up or using set up regularly Goal status: INITIAL   3.  Patient will be able to demonstrate at least 4/5 MMT in B LE  Baseline: see above Goal status: INITIAL   4.  Patient will be able to demonstrate good lifting mechanics to reduce risk of injury to low back  Baseline:  Goal status: INITIAL     PLAN:   PT FREQUENCY: 2x/week   PT DURATION: 6 weeks   PLANNED INTERVENTIONS: Therapeutic exercises, Therapeutic activity, Neuromuscular re-education, Balance training, Gait training, Patient/Family education, Self Care, Joint mobilization, Joint manipulation, Orthotic/Fit training, Aquatic Therapy, Dry Needling, Electrical stimulation, Spinal manipulation, Spinal mobilization, Cryotherapy, Moist heat, Vasopneumatic device, Traction, Ionotophoresis 4mg /ml Dexamethasone, Manual therapy, and Re-evaluation.   PLAN FOR NEXT SESSION: pelvic tilts, post chain activation, lumbar ROM, core activation breathing, hip hinge, bridges     4:03 PM, 03/01/22 Tereasa Coop, DPT Physical Therapy with [facility]

## 2022-03-05 ENCOUNTER — Encounter: Payer: 59 | Admitting: Physical Therapy

## 2022-03-07 ENCOUNTER — Encounter: Payer: 59 | Admitting: Physical Therapy

## 2022-03-12 ENCOUNTER — Encounter: Payer: 59 | Admitting: Physical Therapy

## 2022-03-15 ENCOUNTER — Encounter: Payer: 59 | Admitting: Physical Therapy

## 2022-03-19 ENCOUNTER — Encounter: Payer: 59 | Admitting: Physical Therapy

## 2022-03-22 ENCOUNTER — Encounter: Payer: 59 | Admitting: Physical Therapy

## 2022-03-26 ENCOUNTER — Encounter: Payer: 59 | Admitting: Physical Therapy

## 2022-03-29 ENCOUNTER — Encounter: Payer: 59 | Admitting: Physical Therapy

## 2022-04-12 ENCOUNTER — Other Ambulatory Visit: Payer: Self-pay | Admitting: Internal Medicine

## 2022-05-21 ENCOUNTER — Ambulatory Visit (INDEPENDENT_AMBULATORY_CARE_PROVIDER_SITE_OTHER): Payer: 59

## 2022-05-21 ENCOUNTER — Ambulatory Visit (INDEPENDENT_AMBULATORY_CARE_PROVIDER_SITE_OTHER): Payer: 59 | Admitting: Pulmonary Disease

## 2022-05-21 ENCOUNTER — Encounter (HOSPITAL_BASED_OUTPATIENT_CLINIC_OR_DEPARTMENT_OTHER): Payer: Self-pay | Admitting: Pulmonary Disease

## 2022-05-21 VITALS — BP 110/78 | HR 81 | Temp 98.1°F | Ht 72.0 in | Wt 235.2 lb

## 2022-05-21 DIAGNOSIS — R059 Cough, unspecified: Secondary | ICD-10-CM

## 2022-05-21 DIAGNOSIS — G4733 Obstructive sleep apnea (adult) (pediatric): Secondary | ICD-10-CM | POA: Diagnosis not present

## 2022-05-21 MED ORDER — HYDROCODONE BIT-HOMATROP MBR 5-1.5 MG/5ML PO SOLN: 5.0000 mL | Freq: Two times a day (BID) | ORAL | 0 refills | Status: DC | PRN

## 2022-05-21 MED ORDER — FLUTICASONE FUROATE-VILANTEROL 100-25 MCG/ACT IN AEPB: 1.0000 | INHALATION_SPRAY | Freq: Every day | RESPIRATORY_TRACT | 0 refills | Status: DC

## 2022-05-21 NOTE — Progress Notes (Signed)
   Subjective:    Patient ID: Andrew Rowland, male    DOB: 24-Nov-1960, 62 y.o.   MRN: [id number]  HPI  62 yo with OSA  - could not tolerate CPAP He had an episode of post bronchitic chronic cough in January 2020 which finally resolved after 12 weeks  10/2021 Post bronchitic cough -abx x 2, hycodan, breo, protonix  Chief Complaint  Patient presents with   Follow-up    Patient states he can't sleep at night because of the cough and he thinks it's acid reflux. Patient has had cough for 2 weeks.     New onset cough for 3 weeks mild fever at onset, mostly nonproductive dry cough.  He does not have a history of seasonal allergies. 3 pillow orthopnea, he has a history of severe reflux has been on PPI for several years.  He reports mild postnasal drip, no wheezing.  No sick contacts.  He was unable to tolerate full facemask.  Request alternatives.  Has been snoring   Significant tests/ events reviewed 03/2019 HST (217 lbs ) which showed moderate OSA 26/h, severe in supine position   Spirometry 02/2020 no obstruction, ratio 83, FEV1 75%, FVC 71%, 10% improvement postbronchodilator   Review of Systems neg for any significant sore throat, dysphagia, itching, sneezing, nasal congestion or excess/ purulent secretions, fever, chills, sweats, unintended wt loss, pleuritic or exertional cp, hempoptysis, orthopnea pnd or change in chronic leg swelling. Also denies presyncope, palpitations, heartburn, abdominal pain, nausea, vomiting, diarrhea or change in bowel or urinary habits, dysuria,hematuria, rash, arthralgias, visual complaints, headache, numbness weakness or ataxia.     Objective:   Physical Exam  Gen. Pleasant, obese, in no distress ENT - no lesions, no post nasal drip Neck: No JVD, no thyromegaly, no carotid bruits Lungs: no use of accessory muscles, no dullness to percussion, fine left basilar crackles Cardiovascular: Rhythm regular, heart sounds  normal, no murmurs or gallops, no  peripheral edema Musculoskeletal: No deformities, no cyanosis or clubbing , no tremors       Assessment & Plan:

## 2022-05-21 NOTE — Addendum Note (Signed)
Addended by: Oretha Milch on: 05/21/2022 04:25 PM   Modules accepted: Orders

## 2022-05-21 NOTE — Assessment & Plan Note (Addendum)
Subacute cough for 3 weeks, no URI symptoms at onset but low-grade fever.  No sick contacts.  Never tested for COVID.  Will treat for postnasal drip/upper airway cough syndrome with chlorpheniramine/phenylephrine combination for 2 to 3 weeks.  Hycodan for immediately relief. Previously has responded to this regimen. He will continue on antireflux medications and elevation when he sleeps. If persistent beyond this, Will use inhaled steroid.  Previously had relief from [facility] and has this at home which she can start Will obtain chest x-ray given previous abnormal imaging results when he had them done in [place]

## 2022-05-21 NOTE — Patient Instructions (Addendum)
X CXR today for cough  Trial of chlorpheniramine 4 mg at bedtime + phenylephrine 10 mg daytime  (combination [facility] brand 'sinus PE') x 2 weeks  X Hycodan cough syrup RX  Continue reflux medication  X Sample of Breo 100  Call in 2 weeks to report

## 2022-05-21 NOTE — Assessment & Plan Note (Signed)
Did not tolerate full facemask.  We discussed further mask interfaces and he is willing to trial nasal cradle mask  Weight loss encouraged, compliance with goal of at least 4-6 hrs every night is the expectation. Advised against medications with sedative side effects Cautioned against driving when sleepy - understanding that sleepiness will vary on a day to day basis

## 2022-05-29 ENCOUNTER — Other Ambulatory Visit: Payer: Self-pay | Admitting: Internal Medicine

## 2022-10-19 ENCOUNTER — Other Ambulatory Visit: Payer: Self-pay | Admitting: Internal Medicine

## 2022-12-18 ENCOUNTER — Encounter: Payer: Self-pay | Admitting: Family Medicine

## 2022-12-18 ENCOUNTER — Ambulatory Visit (INDEPENDENT_AMBULATORY_CARE_PROVIDER_SITE_OTHER): Payer: 59 | Admitting: Family Medicine

## 2022-12-18 ENCOUNTER — Other Ambulatory Visit: Payer: Self-pay | Admitting: Family Medicine

## 2022-12-18 VITALS — BP 102/74 | HR 84 | Temp 97.5°F | Ht 73.0 in | Wt 239.4 lb

## 2022-12-18 DIAGNOSIS — K439 Ventral hernia without obstruction or gangrene: Secondary | ICD-10-CM | POA: Diagnosis not present

## 2022-12-18 DIAGNOSIS — N2889 Other specified disorders of kidney and ureter: Secondary | ICD-10-CM | POA: Diagnosis not present

## 2022-12-18 DIAGNOSIS — M5441 Lumbago with sciatica, right side: Secondary | ICD-10-CM

## 2022-12-18 DIAGNOSIS — R1011 Right upper quadrant pain: Secondary | ICD-10-CM | POA: Diagnosis not present

## 2022-12-18 DIAGNOSIS — R1013 Epigastric pain: Secondary | ICD-10-CM

## 2022-12-18 DIAGNOSIS — N281 Cyst of kidney, acquired: Secondary | ICD-10-CM | POA: Diagnosis not present

## 2022-12-18 LAB — COMPREHENSIVE METABOLIC PANEL
ALT: 20 U/L (ref 0–53)
AST: 30 U/L (ref 0–37)
Albumin: 4.4 g/dL (ref 3.5–5.2)
Alkaline Phosphatase: 54 U/L (ref 39–117)
BUN: 17 mg/dL (ref 6–23)
CO2: 29 meq/L (ref 19–32)
Calcium: 9.4 mg/dL (ref 8.4–10.5)
Chloride: 103 meq/L (ref 96–112)
Creatinine, Ser: 1.07 mg/dL (ref 0.40–1.50)
GFR: 74.14 mL/min (ref >60.00)
Glucose, Bld: 99 mg/dL (ref 70–99)
Potassium: 4.5 meq/L (ref 3.5–5.1)
Sodium: 137 meq/L (ref 135–145)
Total Bilirubin: 0.7 mg/dL (ref 0.2–1.2)
Total Protein: 7.4 g/dL (ref 6.0–8.3)

## 2022-12-18 LAB — CBC WITH DIFFERENTIAL/PLATELET
Basophils Absolute: 0.1 10*3/uL (ref 0.0–0.1)
Basophils Relative: 1.1 % (ref 0.0–3.0)
Eosinophils Absolute: 0.1 10*3/uL (ref 0.0–0.7)
Eosinophils Relative: 1.5 % (ref 0.0–5.0)
HCT: 45.2 % (ref 39.0–52.0)
Hemoglobin: 14.9 g/dL (ref 13.0–17.0)
Lymphocytes Relative: 35.1 % (ref 12.0–46.0)
Lymphs Abs: 2.1 10*3/uL (ref 0.7–4.0)
MCHC: 33 g/dL (ref 30.0–36.0)
MCV: 92.9 fL (ref 78.0–100.0)
Monocytes Absolute: 0.8 10*3/uL (ref 0.1–1.0)
Monocytes Relative: 13.5 % — ABNORMAL HIGH (ref 3.0–12.0)
Neutro Abs: 3 10*3/uL (ref 1.4–7.7)
Neutrophils Relative %: 48.8 % (ref 43.0–77.0)
Platelets: 272 10*3/uL (ref 150.0–400.0)
RBC: 4.86 Mil/uL (ref 4.22–5.81)
RDW: 14.3 % (ref 11.5–15.5)
WBC: 6.1 10*3/uL (ref 4.0–10.5)

## 2022-12-18 MED ORDER — DEXLANSOPRAZOLE 60 MG PO CPDR: 60.0000 mg | DELAYED_RELEASE_CAPSULE | Freq: Every day | ORAL | 0 refills | Status: DC

## 2022-12-18 MED ORDER — METHOCARBAMOL 500 MG PO TABS: 500.0000 mg | ORAL_TABLET | Freq: Three times a day (TID) | ORAL | 0 refills | Status: AC | PRN

## 2022-12-18 MED ORDER — FAMOTIDINE 40 MG PO TABS: 40.0000 mg | ORAL_TABLET | Freq: Every day | ORAL | 0 refills | Status: DC

## 2022-12-18 NOTE — Progress Notes (Signed)
Acute Office Visit  Subjective:     Patient ID: Andrew Rowland, male    DOB: 1960/11/11, 62 y.o.   MRN: [id number]  Chief Complaint  Patient presents with   Abdominal Pain    Upper right quarter abdominal pain x 1 week. Worse when coughing. No nausea, vomiting, diarrhea. PT advised by GI doctor to change pantoprazole RX to dexilant    Back Pain    History of back pain with past surgeries. Lower back pain has gotten worse    HPI Patient is in today for evaluation of epigastric pain for the last week.  Also reports right sided abdominal pain over the same time. States that the pain is worse when coughing.  Denies nausea, vomiting, diarrhea. States that he was told by GI last night to change pantoprazole to Dexilant.  Patient states that he has some Dexilant left from when he was visiting [place] and brought home with him. Has history of right renal cyst, he is concerned that this may be growing and causing pain in the area. Denies any changes in urinary output, no hematuria noted, no chills, no fever, denies other symptoms.   Separately reports bilateral low back pain, along with right mid back pain.  Reports that he took a baclofen last night and this did not help him.  States that he also took ibuprofen with no relief.  States that the pain kept him up last night. Has history of low back pain with previous surgery. Denies any numbness or tingling, loss of balance or falling, denies known injury, other symptoms. Medical history as outlined below.  ROS Per HPI      Objective:    BP 102/74   Pulse 84   Temp (!) 97.5 F (36.4 C) (Oral)   Ht 6\' 1"  (1.854 m)   Wt 239 lb 6.4 oz (108.6 kg)   SpO2 95%   BMI 31.59 kg/m    Physical Exam Vitals and nursing note reviewed.  Constitutional:      General: He is not in acute distress.    Appearance: He is well-developed. He is not ill-appearing.     Comments: Appears fatigued  HENT:     Head: Normocephalic and atraumatic.   Cardiovascular:     Rate and Rhythm: Normal rate and regular rhythm.     Heart sounds: Normal heart sounds.  Pulmonary:     Effort: Pulmonary effort is normal.     Breath sounds: Normal breath sounds.  Abdominal:     General: Abdomen is protuberant.     Palpations: Abdomen is soft. There is no hepatomegaly or splenomegaly. Mass: mid upper abd c/w ventral hernia.    Tenderness: There is abdominal tenderness in the right upper quadrant and right lower quadrant. There is no rebound. Negative signs include Murphy's sign, Rovsing's sign and McBurney's sign.     Hernia: A hernia is present. Hernia is present in the ventral area.  Neurological:     General: No focal deficit present.     Mental Status: He is alert and oriented to person, place, and time.  Psychiatric:        Mood and Affect: Mood normal.        Behavior: Behavior normal.     No results found for any visits on 12/18/22.      Assessment & Plan:  1. Acute right-sided low back pain with right-sided sciatica  - methocarbamol (ROBAXIN) 500 MG tablet; Take 1 tablet (500 mg total) by mouth  every 8 (eight) hours as needed for up to 10 days for muscle spasms.  Dispense: 30 tablet; Refill: 0 - May use ice, heat, topical rub as needed - Follow up with sports medicine as needed if symptoms are not improving  2. Epigastric pain  - dexlansoprazole (DEXILANT) 60 MG capsule; Take 1 capsule (60 mg total) by mouth daily.  Dispense: 30 capsule; Refill: 0 - famotidine (PEPCID) 40 MG tablet; Take 1 tablet (40 mg total) by mouth at bedtime.  Dispense: 30 tablet; Refill: 0 - CBC with Differential/Platelet - Comprehensive metabolic panel - CT ABDOMEN W WO CONTRAST; Future - F/u with GI, worsening GERD?  3. RUQ abdominal pain  - CBC with Differential/Platelet - Comprehensive metabolic panel - CT ABDOMEN W WO CONTRAST; Future  4. Other specified disorders of kidney and ureter  - CT ABDOMEN W WO CONTRAST; Future  5. Ventral hernia  without obstruction or gangrene  -  CT ABDOMEN W WO CONTRAST; Future  6. Acquired renal cyst of right kidney  -  CT ABDOMEN W WO CONTRAST; Future  Meds ordered this encounter  Medications   methocarbamol (ROBAXIN) 500 MG tablet    Sig: Take 1 tablet (500 mg total) by mouth every 8 (eight) hours as needed for up to 10 days for muscle spasms.    Dispense:  30 tablet    Refill:  0   dexlansoprazole (DEXILANT) 60 MG capsule    Sig: Take 1 capsule (60 mg total) by mouth daily.    Dispense:  30 capsule    Refill:  0   famotidine (PEPCID) 40 MG tablet    Sig: Take 1 tablet (40 mg total) by mouth at bedtime.    Dispense:  30 tablet    Refill:  0    Return if symptoms worsen or fail to improve.  Moshe Cipro, FNP

## 2022-12-19 ENCOUNTER — Ambulatory Visit (HOSPITAL_BASED_OUTPATIENT_CLINIC_OR_DEPARTMENT_OTHER): Payer: 59

## 2022-12-19 ENCOUNTER — Ambulatory Visit (INDEPENDENT_AMBULATORY_CARE_PROVIDER_SITE_OTHER): Payer: 59 | Admitting: Internal Medicine

## 2022-12-19 ENCOUNTER — Encounter: Payer: Self-pay | Admitting: Internal Medicine

## 2022-12-19 VITALS — BP 110/70 | HR 62 | Temp 98.2°F | Ht 73.0 in | Wt 230.0 lb

## 2022-12-19 DIAGNOSIS — R1013 Epigastric pain: Secondary | ICD-10-CM

## 2022-12-19 DIAGNOSIS — I10 Essential (primary) hypertension: Secondary | ICD-10-CM

## 2022-12-19 DIAGNOSIS — N281 Cyst of kidney, acquired: Secondary | ICD-10-CM | POA: Insufficient documentation

## 2022-12-19 DIAGNOSIS — B029 Zoster without complications: Secondary | ICD-10-CM | POA: Diagnosis not present

## 2022-12-19 DIAGNOSIS — K219 Gastro-esophageal reflux disease without esophagitis: Secondary | ICD-10-CM

## 2022-12-19 DIAGNOSIS — E785 Hyperlipidemia, unspecified: Secondary | ICD-10-CM

## 2022-12-19 MED ORDER — VALACYCLOVIR HCL 1 G PO TABS: 1000.0000 mg | ORAL_TABLET | Freq: Three times a day (TID) | ORAL | 1 refills | Status: DC

## 2022-12-19 MED ORDER — GABAPENTIN 100 MG PO CAPS: 100.0000 mg | ORAL_CAPSULE | Freq: Three times a day (TID) | ORAL | 3 refills | Status: DC | PRN

## 2022-12-19 MED ORDER — DEXLANSOPRAZOLE 60 MG PO CPDR
60.0000 mg | DELAYED_RELEASE_CAPSULE | Freq: Every day | ORAL | 3 refills | Status: DC
Start: 2022-12-19 — End: 2023-02-11

## 2022-12-19 NOTE — Progress Notes (Signed)
Subjective:  Patient ID: Andrew Rowland, male    DOB: Nov 03, 1960  Age: 62 y.o. MRN: [id number]  CC: Medical Management of Chronic Issues (Pt is here to f/u on his recent visit 12/18/2022, pt states he is still in pain.)   HPI Andrew Rowland presents for epigastric to RUQ pain and to the back on the R x 1 week 4-5/10 in intensity. No n/v, fever    Alo is c/o LBP  Outpatient Medications Prior to Visit  Medication Sig Dispense Refill   amLODipine (NORVASC) 5 MG tablet Take 1 tablet (5 mg total) by mouth daily. 90 tablet 3   famotidine (PEPCID) 40 MG tablet Take 1 tablet (40 mg total) by mouth at bedtime. 30 tablet 0   finasteride (PROSCAR) 5 MG tablet TAKE 1 TABLET (5 MG TOTAL) BY MOUTH DAILY. 90 tablet 3   fluticasone furoate-vilanterol (BREO ELLIPTA) 100-25 MCG/ACT AEPB Inhale 1 puff into the lungs daily. 60 each 0   HYDROcodone bit-homatropine (HYCODAN) 5-1.5 MG/5ML syrup Take 5 mLs by mouth every 12 (twelve) hours as needed for cough. 240 mL 0   methocarbamol (ROBAXIN) 500 MG tablet Take 1 tablet (500 mg total) by mouth every 8 (eight) hours as needed for up to 10 days for muscle spasms. 30 tablet 0   olmesartan (BENICAR) 20 MG tablet TAKE 1 TABLET BY MOUTH EVERY DAY 90 tablet 3   dexlansoprazole (DEXILANT) 60 MG capsule Take 1 capsule (60 mg total) by mouth daily. 30 capsule 0   pantoprazole (PROTONIX) 40 MG tablet TAKE 1 TABLET BY MOUTH EVERY DAY (Patient not taking: Reported on 12/18/2022) 90 tablet 2   No facility-administered medications prior to visit.    ROS: Review of Systems  Constitutional:  Negative for appetite change, fatigue and unexpected weight change.  HENT:  Negative for congestion, nosebleeds, sneezing, sore throat and trouble swallowing.   Eyes:  Negative for itching and visual disturbance.  Respiratory:  Negative for cough.   Cardiovascular:  Negative for chest pain, palpitations and leg swelling.  Gastrointestinal:  Positive for abdominal pain. Negative  for abdominal distention, blood in stool, diarrhea and nausea.  Genitourinary:  Negative for frequency and hematuria.  Musculoskeletal:  Positive for back pain. Negative for gait problem, joint swelling and neck pain.  Skin:  Negative for rash.  Neurological:  Negative for dizziness, tremors, speech difficulty and weakness.  Psychiatric/Behavioral:  Negative for agitation, decreased concentration, dysphoric mood and sleep disturbance. The patient is not nervous/anxious.     Objective:  BP 110/70   Pulse 62   Temp 98.2 F (36.8 C)   Ht 6\' 1"  (1.854 m)   Wt 230 lb (104.3 kg)   BMI 30.34 kg/m   BP Readings from Last 3 Encounters:  12/19/22 110/70  12/18/22 102/74  05/21/22 110/78    Wt Readings from Last 3 Encounters:  12/19/22 230 lb (104.3 kg)  12/18/22 239 lb 6.4 oz (108.6 kg)  05/21/22 235 lb 3.2 oz (106.7 kg)    Physical Exam Constitutional:      General: He is not in acute distress.    Appearance: He is well-developed. He is obese.     Comments: NAD  Eyes:     Conjunctiva/sclera: Conjunctivae normal.     Pupils: Pupils are equal, round, and reactive to light.  Neck:     Thyroid: No thyromegaly.     Vascular: No JVD.  Cardiovascular:     Rate and Rhythm: Normal rate and regular rhythm.  Heart sounds: Normal heart sounds. No murmur heard.    No friction rub. No gallop.  Pulmonary:     Effort: Pulmonary effort is normal. No respiratory distress.     Breath sounds: Normal breath sounds. No wheezing or rales.  Chest:     Chest wall: No tenderness.  Abdominal:     General: Bowel sounds are normal. There is no distension.     Palpations: Abdomen is soft. There is no mass.     Tenderness: There is no abdominal tenderness. There is no guarding or rebound.  Musculoskeletal:        General: No tenderness. Normal range of motion.     Cervical back: Normal range of motion.  Lymphadenopathy:     Cervical: No cervical adenopathy.  Skin:    General: Skin is warm and  dry.     Findings: Rash present.  Neurological:     Mental Status: He is alert and oriented to person, place, and time.     Cranial Nerves: No cranial nerve deficit.     Motor: No abnormal muscle tone.     Coordination: Coordination normal.     Gait: Gait normal.     Deep Tendon Reflexes: Reflexes are normal and symmetric.  Psychiatric:        Behavior: Behavior normal.        Thought Content: Thought content normal.        Judgment: Judgment normal.     Lab Results  Component Value Date   WBC 6.1 12/18/2022   HGB 14.9 12/18/2022   HCT 45.2 12/18/2022   PLT 272.0 12/18/2022   GLUCOSE 99 12/18/2022   CHOL 175 01/18/2022   TRIG 119.0 01/18/2022   HDL 39.70 01/18/2022   LDLCALC 112 (H) 01/18/2022   ALT 20 12/18/2022   AST 30 12/18/2022   NA 137 12/18/2022   K 4.5 12/18/2022   CL 103 12/18/2022   CREATININE 1.07 12/18/2022   BUN 17 12/18/2022   CO2 29 12/18/2022   TSH 2.11 01/18/2022   PSA 0.94 01/18/2022    No results found.  Assessment & Plan:   Problem List Items Addressed This Visit     HTN (hypertension)    Ordered Coronary calcium CT scan      GERD (gastroesophageal reflux disease)    Dexilant works best Pt had GERD sx's on Protonix      Relevant Medications   dexlansoprazole (DEXILANT) 60 MG capsule   Herpes zoster    New R side - see pics Valtrex x 7 d Gabapentin      Relevant Medications   valACYclovir (VALTREX) 1000 MG tablet   Renal cyst, acquired, right    Remote Will re-check w/US      Other Visit Diagnoses     Renal cyst    -  Primary   Relevant Orders   US RENAL   Epigastric pain       Relevant Medications   dexlansoprazole (DEXILANT) 60 MG capsule   Dyslipidemia       Relevant Orders   CT CARDIAC SCORING (SELF PAY ONLY)         Meds ordered this encounter  Medications   dexlansoprazole (DEXILANT) 60 MG capsule    Sig: Take 1 capsule (60 mg total) by mouth daily.    Dispense:  90 capsule    Refill:  3    Failed  Protonix   valACYclovir (VALTREX) 1000 MG tablet    Sig: Take 1 tablet (1,000  mg total) by mouth 3 (three) times daily.    Dispense:  21 tablet    Refill:  1   gabapentin (NEURONTIN) 100 MG capsule    Sig: Take 1 capsule (100 mg total) by mouth 3 (three) times daily as needed.    Dispense:  90 capsule    Refill:  3      Follow-up: Return for Wellness Exam.  Sonda Primes, MD

## 2022-12-19 NOTE — Assessment & Plan Note (Signed)
New R side - see pics Valtrex x 7 d Gabapentin

## 2022-12-19 NOTE — Assessment & Plan Note (Signed)
Ordered Coronary calcium CT scan

## 2022-12-19 NOTE — Assessment & Plan Note (Signed)
Dexilant works best Pt had GERD sx's on Protonix

## 2022-12-19 NOTE — Assessment & Plan Note (Signed)
Remote Will re-check w/US

## 2022-12-20 ENCOUNTER — Ambulatory Visit: Payer: 59 | Admitting: Internal Medicine

## 2022-12-20 ENCOUNTER — Ambulatory Visit (HOSPITAL_BASED_OUTPATIENT_CLINIC_OR_DEPARTMENT_OTHER)
Admission: RE | Admit: 2022-12-20 | Discharge: 2022-12-20 | Disposition: A | Discharge status: Home / Self Care | Payer: 59 | Source: Ambulatory Visit | Attending: Internal Medicine | Admitting: Internal Medicine

## 2022-12-20 DIAGNOSIS — N281 Cyst of kidney, acquired: Secondary | ICD-10-CM | POA: Insufficient documentation

## 2022-12-26 ENCOUNTER — Ambulatory Visit (INDEPENDENT_AMBULATORY_CARE_PROVIDER_SITE_OTHER): Payer: 59 | Admitting: Internal Medicine

## 2022-12-26 ENCOUNTER — Encounter: Payer: Self-pay | Admitting: Internal Medicine

## 2022-12-26 VITALS — BP 90/60 | HR 80 | Temp 98.0°F | Ht 73.0 in | Wt 239.2 lb

## 2022-12-26 DIAGNOSIS — B0229 Other postherpetic nervous system involvement: Secondary | ICD-10-CM

## 2022-12-26 DIAGNOSIS — E538 Deficiency of other specified B group vitamins: Secondary | ICD-10-CM

## 2022-12-26 MED ORDER — METHYLPREDNISOLONE 4 MG PO TBPK: ORAL_TABLET | ORAL | 0 refills | Status: DC

## 2022-12-26 MED ORDER — GABAPENTIN 300 MG PO CAPS: 300.0000 mg | ORAL_CAPSULE | Freq: Three times a day (TID) | ORAL | 1 refills | Status: DC

## 2022-12-26 MED ORDER — TRAMADOL HCL 50 MG PO TABS: 50.0000 mg | ORAL_TABLET | Freq: Four times a day (QID) | ORAL | 1 refills | Status: DC | PRN

## 2022-12-26 NOTE — Assessment & Plan Note (Signed)
On B12 

## 2022-12-26 NOTE — Progress Notes (Signed)
Subjective:  Patient ID: CYE EINSTEIN, male    DOB: 03/07/1960  Age: 62 y.o. MRN: [id number]  CC: Cyst (Follow up on renal cyst. Blistering has gone down but still having pain when sitting, walking, laying down) and Shortness of Breath (Having trouble breathing due to pain)   HPI Andrew Rowland presents for severe burning pain in the rash area 10/10 at times, burning. Can't stand touching skin with clothes etc No deep pain  Outpatient Medications Prior to Visit  Medication Sig Dispense Refill   amLODipine (NORVASC) 5 MG tablet Take 1 tablet (5 mg total) by mouth daily. 90 tablet 3   dexlansoprazole (DEXILANT) 60 MG capsule Take 1 capsule (60 mg total) by mouth daily. 90 capsule 3   famotidine (PEPCID) 40 MG tablet Take 1 tablet (40 mg total) by mouth at bedtime. 30 tablet 0   finasteride (PROSCAR) 5 MG tablet TAKE 1 TABLET (5 MG TOTAL) BY MOUTH DAILY. 90 tablet 3   fluticasone furoate-vilanterol (BREO ELLIPTA) 100-25 MCG/ACT AEPB Inhale 1 puff into the lungs daily. 60 each 0   methocarbamol (ROBAXIN) 500 MG tablet Take 1 tablet (500 mg total) by mouth every 8 (eight) hours as needed for up to 10 days for muscle spasms. 30 tablet 0   olmesartan (BENICAR) 20 MG tablet TAKE 1 TABLET BY MOUTH EVERY DAY 90 tablet 3   valACYclovir (VALTREX) 1000 MG tablet Take 1 tablet (1,000 mg total) by mouth 3 (three) times daily. 21 tablet 1   gabapentin (NEURONTIN) 100 MG capsule Take 1 capsule (100 mg total) by mouth 3 (three) times daily as needed. 90 capsule 3   HYDROcodone bit-homatropine (HYCODAN) 5-1.5 MG/5ML syrup Take 5 mLs by mouth every 12 (twelve) hours as needed for cough. 240 mL 0   pantoprazole (PROTONIX) 40 MG tablet TAKE 1 TABLET BY MOUTH EVERY DAY 90 tablet 2   No facility-administered medications prior to visit.    ROS: Review of Systems  Constitutional:  Negative for appetite change, fatigue and unexpected weight change.  HENT:  Negative for congestion, nosebleeds, sneezing,  sore throat and trouble swallowing.   Eyes:  Negative for itching and visual disturbance.  Respiratory:  Negative for cough, shortness of breath and wheezing.   Cardiovascular:  Positive for chest pain. Negative for palpitations and leg swelling.  Gastrointestinal:  Negative for abdominal distention, blood in stool, diarrhea and nausea.  Genitourinary:  Negative for frequency and hematuria.  Musculoskeletal:  Negative for back pain, gait problem, joint swelling and neck pain.  Skin:  Positive for rash.  Neurological:  Negative for dizziness, tremors, speech difficulty and weakness.  Psychiatric/Behavioral:  Positive for sleep disturbance. Negative for agitation and dysphoric mood. The patient is not nervous/anxious.     Objective:  BP 90/60   Pulse 80   Temp 98 F (36.7 C) (Oral)   Ht 6\' 1"  (1.854 m)   Wt 239 lb 3.2 oz (108.5 kg)   SpO2 94%   BMI 31.56 kg/m   BP Readings from Last 3 Encounters:  12/26/22 90/60  12/19/22 110/70  12/18/22 102/74    Wt Readings from Last 3 Encounters:  12/26/22 239 lb 3.2 oz (108.5 kg)  12/19/22 230 lb (104.3 kg)  12/18/22 239 lb 6.4 oz (108.6 kg)    Physical Exam Constitutional:      General: He is not in acute distress.    Appearance: He is well-developed.     Comments: NAD  Eyes:     Conjunctiva/sclera:  Conjunctivae normal.     Pupils: Pupils are equal, round, and reactive to light.  Neck:     Thyroid: No thyromegaly.     Vascular: No JVD.  Cardiovascular:     Rate and Rhythm: Normal rate and regular rhythm.     Heart sounds: Normal heart sounds. No murmur heard.    No friction rub. No gallop.  Pulmonary:     Effort: Pulmonary effort is normal. No respiratory distress.     Breath sounds: Normal breath sounds. No wheezing or rales.  Chest:     Chest wall: No tenderness.  Abdominal:     General: Bowel sounds are normal. There is no distension.     Palpations: Abdomen is soft. There is no mass.     Tenderness: There is no  abdominal tenderness. There is no guarding or rebound.  Musculoskeletal:        General: No tenderness. Normal range of motion.     Cervical back: Normal range of motion.  Lymphadenopathy:     Cervical: No cervical adenopathy.  Skin:    General: Skin is warm and dry.     Findings: No rash.  Neurological:     Mental Status: He is alert and oriented to person, place, and time.     Cranial Nerves: No cranial nerve deficit.     Motor: No abnormal muscle tone.     Coordination: Coordination normal.     Gait: Gait normal.     Deep Tendon Reflexes: Reflexes are normal and symmetric.  Psychiatric:        Behavior: Behavior normal.        Thought Content: Thought content normal.        Judgment: Judgment normal.   Rash is dried up RUQ w/pain superficially  Lab Results  Component Value Date   WBC 6.1 12/18/2022   HGB 14.9 12/18/2022   HCT 45.2 12/18/2022   PLT 272.0 12/18/2022   GLUCOSE 99 12/18/2022   CHOL 175 01/18/2022   TRIG 119.0 01/18/2022   HDL 39.70 01/18/2022   LDLCALC 112 (H) 01/18/2022   ALT 20 12/18/2022   AST 30 12/18/2022   NA 137 12/18/2022   K 4.5 12/18/2022   CL 103 12/18/2022   CREATININE 1.07 12/18/2022   BUN 17 12/18/2022   CO2 29 12/18/2022   TSH 2.11 01/18/2022   PSA 0.94 01/18/2022    US RENAL  Result Date: 12/20/2022 CLINICAL DATA:  Follow-up renal cyst EXAM: RENAL / URINARY TRACT ULTRASOUND COMPLETE COMPARISON:  MRI abdomen 05/23/2020 FINDINGS: Right Kidney: Renal measurements: 11.3 x 5.6 x 5.4 cm = volume: 179.4 mL. Normal renal cortical thickness and echogenicity. No hydronephrosis. There is a 1 cm cyst superior pole right kidney. There is a similar-appearing 3.4 cm exophytic cyst lower pole right kidney. No imaging follow-up needed. Left Kidney: Renal measurements: 10.9 x 5.7 x 5.6 cm = volume: 180.8 mL. Echogenicity within normal limits. No mass or hydronephrosis visualized. Bladder: Appears normal for degree of bladder distention. Other: None.  IMPRESSION: No hydronephrosis. Electronically Signed   By: Annia Belt M.D.   On: 12/20/2022 09:32    Assessment & Plan:   Problem List Items Addressed This Visit     B12 deficiency    On B12.      Postherpetic neuralgia - Primary    R chest Try Capsicin cream (use gloves to apply) Blue-Emu cream -  use 2-3 times a day Tramadol prn  Potential benefits of a short term  opioids use as well as potential risks (i.e. addiction risk, apnea etc) and complications (i.e. Somnolence, constipation and others) were explained to the patient and were aknowledged. Increase Gabapentin to 300 mg tid          Meds ordered this encounter  Medications   gabapentin (NEURONTIN) 300 MG capsule    Sig: Take 1 capsule (300 mg total) by mouth 3 (three) times daily.    Dispense:  90 capsule    Refill:  1   traMADol (ULTRAM) 50 MG tablet    Sig: Take 1 tablet (50 mg total) by mouth every 6 (six) hours as needed for severe pain (pain score 7-10).    Dispense:  20 tablet    Refill:  1   methylPREDNISolone (MEDROL DOSEPAK) 4 MG TBPK tablet    Sig: As directed    Dispense:  21 tablet    Refill:  0      Follow-up: Return in about 6 weeks (around 02/06/2023) for a follow-up visit.  Sonda Primes, MD

## 2022-12-26 NOTE — Patient Instructions (Addendum)
Try Capsaicin cream (use gloves to apply)  Blue-Emu cream -  use 2-3 times a day

## 2022-12-26 NOTE — Assessment & Plan Note (Addendum)
R chest Try Capsicin cream (use gloves to apply) Blue-Emu cream -  use 2-3 times a day Tramadol prn  Potential benefits of a short term opioids use as well as potential risks (i.e. addiction risk, apnea etc) and complications (i.e. Somnolence, constipation and others) were explained to the patient and were aknowledged. Increase Gabapentin to 300 mg tid

## 2023-01-15 ENCOUNTER — Other Ambulatory Visit: Payer: Self-pay | Admitting: Internal Medicine

## 2023-01-18 ENCOUNTER — Other Ambulatory Visit: Payer: Self-pay | Admitting: Family Medicine

## 2023-01-18 DIAGNOSIS — R1013 Epigastric pain: Secondary | ICD-10-CM

## 2023-02-11 ENCOUNTER — Encounter: Payer: Self-pay | Admitting: Internal Medicine

## 2023-02-11 ENCOUNTER — Ambulatory Visit (INDEPENDENT_AMBULATORY_CARE_PROVIDER_SITE_OTHER): Payer: 59 | Admitting: Internal Medicine

## 2023-02-11 VITALS — BP 110/80 | HR 86 | Temp 98.6°F | Ht 73.0 in | Wt 237.0 lb

## 2023-02-11 DIAGNOSIS — E538 Deficiency of other specified B group vitamins: Secondary | ICD-10-CM | POA: Diagnosis not present

## 2023-02-11 DIAGNOSIS — I10 Essential (primary) hypertension: Secondary | ICD-10-CM

## 2023-02-11 DIAGNOSIS — Z0000 Encounter for general adult medical examination without abnormal findings: Secondary | ICD-10-CM | POA: Diagnosis not present

## 2023-02-11 DIAGNOSIS — R202 Paresthesia of skin: Secondary | ICD-10-CM | POA: Diagnosis not present

## 2023-02-11 LAB — CBC WITH DIFFERENTIAL/PLATELET
Basophils Absolute: 0.1 10*3/uL (ref 0.0–0.1)
Basophils Relative: 1 % (ref 0.0–3.0)
Eosinophils Absolute: 0.2 10*3/uL (ref 0.0–0.7)
Eosinophils Relative: 3 % (ref 0.0–5.0)
HCT: 45.5 % (ref 39.0–52.0)
Hemoglobin: 15.1 g/dL (ref 13.0–17.0)
Lymphocytes Relative: 30.3 % (ref 12.0–46.0)
Lymphs Abs: 1.9 10*3/uL (ref 0.7–4.0)
MCHC: 33.3 g/dL (ref 30.0–36.0)
MCV: 93.5 fL (ref 78.0–100.0)
Monocytes Absolute: 0.8 10*3/uL (ref 0.1–1.0)
Monocytes Relative: 12.1 % — ABNORMAL HIGH (ref 3.0–12.0)
Neutro Abs: 3.3 10*3/uL (ref 1.4–7.7)
Neutrophils Relative %: 53.6 % (ref 43.0–77.0)
Platelets: 256 10*3/uL (ref 150.0–400.0)
RBC: 4.87 Mil/uL (ref 4.22–5.81)
RDW: 14.7 % (ref 11.5–15.5)
WBC: 6.3 10*3/uL (ref 4.0–10.5)

## 2023-02-11 LAB — LIPID PANEL
Cholesterol: 195 mg/dL (ref 0–200)
HDL: 45.3 mg/dL (ref >39.00)
LDL Cholesterol: 123 mg/dL — ABNORMAL HIGH (ref 0–99)
NonHDL: 149.53
Total CHOL/HDL Ratio: 4
Triglycerides: 133 mg/dL (ref 0.0–149.0)
VLDL: 26.6 mg/dL (ref 0.0–40.0)

## 2023-02-11 LAB — COMPREHENSIVE METABOLIC PANEL
ALT: 15 U/L (ref 0–53)
AST: 16 U/L (ref 0–37)
Albumin: 4.3 g/dL (ref 3.5–5.2)
Alkaline Phosphatase: 53 U/L (ref 39–117)
BUN: 14 mg/dL (ref 6–23)
CO2: 25 meq/L (ref 19–32)
Calcium: 9.2 mg/dL (ref 8.4–10.5)
Chloride: 104 meq/L (ref 96–112)
Creatinine, Ser: 1.06 mg/dL (ref 0.40–1.50)
GFR: 74.9 mL/min (ref >60.00)
Glucose, Bld: 92 mg/dL (ref 70–99)
Potassium: 4.3 meq/L (ref 3.5–5.1)
Sodium: 138 meq/L (ref 135–145)
Total Bilirubin: 0.6 mg/dL (ref 0.2–1.2)
Total Protein: 6.8 g/dL (ref 6.0–8.3)

## 2023-02-11 LAB — URINALYSIS
Bilirubin Urine: NEGATIVE
Hgb urine dipstick: NEGATIVE
Ketones, ur: NEGATIVE
Leukocytes,Ua: NEGATIVE
Nitrite: NEGATIVE
Specific Gravity, Urine: 1.02 (ref 1.000–1.030)
Total Protein, Urine: NEGATIVE
Urine Glucose: NEGATIVE
Urobilinogen, UA: 0.2 (ref 0.0–1.0)
pH: 6 (ref 5.0–8.0)

## 2023-02-11 LAB — TSH: TSH: 2.51 u[IU]/mL (ref 0.35–5.50)

## 2023-02-11 LAB — VITAMIN B12: Vitamin B-12: 119 pg/mL — ABNORMAL LOW (ref 211–911)

## 2023-02-11 LAB — PSA: PSA: 0.81 ng/mL (ref 0.10–4.00)

## 2023-02-11 MED ORDER — PANTOPRAZOLE SODIUM 40 MG PO TBEC: 40.0000 mg | DELAYED_RELEASE_TABLET | Freq: Two times a day (BID) | ORAL | 3 refills | Status: DC

## 2023-02-11 NOTE — Assessment & Plan Note (Addendum)
Off B12. Check levels

## 2023-02-11 NOTE — Assessment & Plan Note (Signed)
LLE - foot postop Prop w/pillows

## 2023-02-11 NOTE — Progress Notes (Signed)
Subjective:  Patient ID: Andrew Rowland, male    DOB: [birth year]  Age: 63 y.o. MRN: [id number]  CC: Annual Exam   HPI Andrew Rowland presents for RUQ neuralgia - better C/o LLE (foot) numbness with sitting cross-legged C/o cough  Well exam   Outpatient Medications Prior to Visit  Medication Sig Dispense Refill   amLODipine (NORVASC) 5 MG tablet TAKE 1 TABLET (5 MG TOTAL) BY MOUTH DAILY. 90 tablet 3   famotidine (PEPCID) 40 MG tablet TAKE 1 TABLET BY MOUTH EVERYDAY AT BEDTIME 30 tablet 0   finasteride (PROSCAR) 5 MG tablet TAKE 1 TABLET (5 MG TOTAL) BY MOUTH DAILY. 90 tablet 3   fluticasone furoate-vilanterol (BREO ELLIPTA) 100-25 MCG/ACT AEPB Inhale 1 puff into the lungs daily. 60 each 0   olmesartan (BENICAR) 20 MG tablet TAKE 1 TABLET BY MOUTH EVERY DAY 90 tablet 3   dexlansoprazole (DEXILANT) 60 MG capsule Take 1 capsule (60 mg total) by mouth daily. 90 capsule 3   gabapentin (NEURONTIN) 300 MG capsule Take 1 capsule (300 mg total) by mouth 3 (three) times daily. 90 capsule 1   methylPREDNISolone (MEDROL DOSEPAK) 4 MG TBPK tablet As directed 21 tablet 0   pantoprazole (PROTONIX) 40 MG tablet Take 40 mg by mouth daily.     traMADol (ULTRAM) 50 MG tablet Take 1 tablet (50 mg total) by mouth every 6 (six) hours as needed for severe pain (pain score 7-10). 20 tablet 1   valACYclovir (VALTREX) 1000 MG tablet Take 1 tablet (1,000 mg total) by mouth 3 (three) times daily. 21 tablet 1   No facility-administered medications prior to visit.    ROS: Review of Systems  Constitutional:  Negative for appetite change, fatigue and unexpected weight change.  HENT:  Negative for congestion, nosebleeds, sneezing, sore throat and trouble swallowing.   Eyes:  Negative for itching and visual disturbance.  Respiratory:  Negative for cough.   Cardiovascular:  Negative for chest pain, palpitations and leg swelling.  Gastrointestinal:  Negative for abdominal distention, blood in stool, diarrhea  and nausea.  Genitourinary:  Negative for frequency and hematuria.  Musculoskeletal:  Negative for back pain, gait problem, joint swelling and neck pain.  Skin:  Negative for rash.  Neurological:  Positive for numbness. Negative for dizziness, tremors, speech difficulty and weakness.  Psychiatric/Behavioral:  Negative for agitation, decreased concentration, dysphoric mood and sleep disturbance. The patient is not nervous/anxious.     Objective:  BP 110/80 (BP Location: Left Arm, Patient Position: Sitting, Cuff Size: Normal)   Pulse 86   Temp 98.6 F (37 C) (Oral)   Ht 6\' 1"  (1.854 m)   Wt 237 lb (107.5 kg)   SpO2 93%   BMI 31.27 kg/m   BP Readings from Last 3 Encounters:  02/11/23 110/80  12/26/22 90/60  12/19/22 110/70    Wt Readings from Last 3 Encounters:  02/11/23 237 lb (107.5 kg)  12/26/22 239 lb 3.2 oz (108.5 kg)  12/19/22 230 lb (104.3 kg)    Physical Exam Constitutional:      General: He is not in acute distress.    Appearance: He is well-developed. He is obese.     Comments: NAD  Eyes:     Conjunctiva/sclera: Conjunctivae normal.     Pupils: Pupils are equal, round, and reactive to light.  Neck:     Thyroid: No thyromegaly.     Vascular: No JVD.  Cardiovascular:     Rate and Rhythm: Normal rate and regular  rhythm.     Heart sounds: Normal heart sounds. No murmur heard.    No friction rub. No gallop.  Pulmonary:     Effort: Pulmonary effort is normal. No respiratory distress.     Breath sounds: Normal breath sounds. No wheezing or rales.  Chest:     Chest wall: No tenderness.  Abdominal:     General: Bowel sounds are normal. There is no distension.     Palpations: Abdomen is soft. There is no mass.     Tenderness: There is no abdominal tenderness. There is no guarding or rebound.  Musculoskeletal:        General: No tenderness. Normal range of motion.     Cervical back: Normal range of motion.     Right lower leg: No edema.     Left lower leg: No  edema.  Lymphadenopathy:     Cervical: No cervical adenopathy.  Skin:    General: Skin is warm and dry.     Findings: No rash.  Neurological:     Mental Status: He is alert and oriented to person, place, and time.     Cranial Nerves: No cranial nerve deficit.     Motor: No weakness or abnormal muscle tone.     Coordination: Coordination normal.     Gait: Gait normal.     Deep Tendon Reflexes: Reflexes are normal and symmetric.  Psychiatric:        Behavior: Behavior normal.        Thought Content: Thought content normal.        Judgment: Judgment normal.     Lab Results  Component Value Date   WBC 6.1 12/18/2022   HGB 14.9 12/18/2022   HCT 45.2 12/18/2022   PLT 272.0 12/18/2022   GLUCOSE 99 12/18/2022   CHOL 175 01/18/2022   TRIG 119.0 01/18/2022   HDL 39.70 01/18/2022   LDLCALC 112 (H) 01/18/2022   ALT 20 12/18/2022   AST 30 12/18/2022   NA 137 12/18/2022   K 4.5 12/18/2022   CL 103 12/18/2022   CREATININE 1.07 12/18/2022   BUN 17 12/18/2022   CO2 29 12/18/2022   TSH 2.11 01/18/2022   PSA 0.94 01/18/2022    US RENAL Result Date: 12/20/2022 CLINICAL DATA:  Follow-up renal cyst EXAM: RENAL / URINARY TRACT ULTRASOUND COMPLETE COMPARISON:  MRI abdomen 05/23/2020 FINDINGS: Right Kidney: Renal measurements: 11.3 x 5.6 x 5.4 cm = volume: 179.4 mL. Normal renal cortical thickness and echogenicity. No hydronephrosis. There is a 1 cm cyst superior pole right kidney. There is a similar-appearing 3.4 cm exophytic cyst lower pole right kidney. No imaging follow-up needed. Left Kidney: Renal measurements: 10.9 x 5.7 x 5.6 cm = volume: 180.8 mL. Echogenicity within normal limits. No mass or hydronephrosis visualized. Bladder: Appears normal for degree of bladder distention. Other: None. IMPRESSION: No hydronephrosis. Electronically Signed   By: Annia Belt M.D.   On: 12/20/2022 09:32    Assessment & Plan:   Problem List Items Addressed This Visit     HTN (hypertension)   Ordered  Coronary calcium CT scan      Well adult exam   We discussed age appropriate health related issues, including available/recomended screening tests and vaccinations. Labs were ordered to be later reviewed . All questions were answered. We discussed one or more of the following - seat belt use, use of sunscreen/sun exposure exercise, fall risk reduction, second hand smoke exposure, firearm use and storage, seat belt use, a need  for adhering to healthy diet and exercise. Labs were ordered.  All questions were answered.  CM had a nl colon 2014, 2022 Dr Chales Abrahams Shingrix discussed CT ca scoring sch in 2025      Relevant Orders   Comprehensive metabolic panel   Vitamin B12   TSH   Urinalysis   CBC with Differential/Platelet   Lipid panel   PSA   B12 deficiency - Primary   Off B12. Check levels      Relevant Orders   Vitamin B12      Meds ordered this encounter  Medications   pantoprazole (PROTONIX) 40 MG tablet    Sig: Take 1 tablet (40 mg total) by mouth 2 (two) times daily.    Dispense:  180 tablet    Refill:  3      Follow-up: Return in about 6 months (around 08/11/2023) for a follow-up visit.  Sonda Primes, MD

## 2023-02-11 NOTE — Assessment & Plan Note (Signed)
We discussed age appropriate health related issues, including available/recomended screening tests and vaccinations. Labs were ordered to be later reviewed . All questions were answered. We discussed one or more of the following - seat belt use, use of sunscreen/sun exposure exercise, fall risk reduction, second hand smoke exposure, firearm use and storage, seat belt use, a need for adhering to healthy diet and exercise. Labs were ordered.  All questions were answered.  CM had a nl colon 2014, 2022 Dr Chales Abrahams Shingrix discussed CT ca scoring sch in 2025

## 2023-02-11 NOTE — Assessment & Plan Note (Signed)
Ordered Coronary calcium CT scan

## 2023-02-12 ENCOUNTER — Other Ambulatory Visit: Payer: Self-pay | Admitting: Internal Medicine

## 2023-02-12 ENCOUNTER — Encounter: Payer: Self-pay | Admitting: Internal Medicine

## 2023-02-12 DIAGNOSIS — E538 Deficiency of other specified B group vitamins: Secondary | ICD-10-CM

## 2023-02-12 MED ORDER — B-12 5000 MCG SL SUBL: SUBLINGUAL_TABLET | SUBLINGUAL | 3 refills | Status: DC

## 2023-02-12 NOTE — Assessment & Plan Note (Signed)
B12 sublingual 5000 mcg daily

## 2023-03-13 ENCOUNTER — Ambulatory Visit: Payer: 59

## 2023-03-14 ENCOUNTER — Ambulatory Visit: Payer: 59

## 2023-03-14 DIAGNOSIS — E538 Deficiency of other specified B group vitamins: Secondary | ICD-10-CM

## 2023-03-14 DIAGNOSIS — R1013 Epigastric pain: Secondary | ICD-10-CM

## 2023-03-14 MED ORDER — CYANOCOBALAMIN 1000 MCG/ML IJ SOLN
1000.0000 ug | Freq: Once | INTRAMUSCULAR | Status: AC
  Administered 2023-03-14: 1000 ug via INTRAMUSCULAR

## 2023-03-14 MED ORDER — FAMOTIDINE 40 MG PO TABS: 40.0000 mg | ORAL_TABLET | Freq: Every day | ORAL | 1 refills | Status: AC

## 2023-03-14 NOTE — Addendum Note (Signed)
 Addended by: Theressa Stamps on: 03/14/2023 08:56 AM   Modules accepted: Orders

## 2023-03-14 NOTE — Progress Notes (Addendum)
 Patient visits today to receive her B12 injection/vaccine. Patient was informed and tolerated well. Patient was notified to reach out to [place] if needed.   Medical screening examination/treatment/procedure(s) were performed by non-physician practitioner and as supervising physician I was immediately available for consultation/collaboration.  I agree with above. Jacinta Shoe, MD

## 2023-03-22 NOTE — Telephone Encounter (Signed)
 Spoke with the pt and was able to inform him that his paperwork has been sent into [employer]. Pt stated understanding.

## 2023-04-13 ENCOUNTER — Other Ambulatory Visit: Payer: Self-pay | Admitting: Internal Medicine

## 2023-04-15 ENCOUNTER — Other Ambulatory Visit: Payer: Self-pay | Admitting: Internal Medicine

## 2023-10-15 ENCOUNTER — Other Ambulatory Visit: Payer: Self-pay | Admitting: Internal Medicine

## 2024-01-17 ENCOUNTER — Other Ambulatory Visit: Payer: Self-pay | Admitting: Internal Medicine

## 2024-02-18 ENCOUNTER — Encounter: Admitting: Internal Medicine

## 2024-02-19 ENCOUNTER — Ambulatory Visit: Admitting: Internal Medicine

## 2024-02-19 ENCOUNTER — Encounter: Payer: Self-pay | Admitting: Internal Medicine

## 2024-02-19 VITALS — BP 110/82 | HR 82 | Ht 73.0 in | Wt 228.6 lb

## 2024-02-19 DIAGNOSIS — J45990 Exercise induced bronchospasm: Secondary | ICD-10-CM

## 2024-02-19 DIAGNOSIS — R972 Elevated prostate specific antigen [PSA]: Secondary | ICD-10-CM

## 2024-02-19 DIAGNOSIS — Z0000 Encounter for general adult medical examination without abnormal findings: Secondary | ICD-10-CM

## 2024-02-19 DIAGNOSIS — E538 Deficiency of other specified B group vitamins: Secondary | ICD-10-CM

## 2024-02-19 DIAGNOSIS — E559 Vitamin D deficiency, unspecified: Secondary | ICD-10-CM

## 2024-02-19 DIAGNOSIS — I10 Essential (primary) hypertension: Secondary | ICD-10-CM

## 2024-02-19 DIAGNOSIS — J4521 Mild intermittent asthma with (acute) exacerbation: Secondary | ICD-10-CM

## 2024-02-19 LAB — LIPID PANEL
Cholesterol: 183 mg/dL (ref 28–200)
HDL: 44.6 mg/dL
LDL Cholesterol: 117 mg/dL — ABNORMAL HIGH (ref 10–99)
NonHDL: 138.56
Total CHOL/HDL Ratio: 4
Triglycerides: 110 mg/dL (ref 10.0–149.0)
VLDL: 22 mg/dL (ref 0.0–40.0)

## 2024-02-19 LAB — COMPREHENSIVE METABOLIC PANEL WITH GFR
ALT: 13 U/L (ref 3–53)
AST: 17 U/L (ref 5–37)
Albumin: 4.5 g/dL (ref 3.5–5.2)
Alkaline Phosphatase: 52 U/L (ref 39–117)
BUN: 13 mg/dL (ref 6–23)
CO2: 27 meq/L (ref 19–32)
Calcium: 9.5 mg/dL (ref 8.4–10.5)
Chloride: 102 meq/L (ref 96–112)
Creatinine, Ser: 0.97 mg/dL (ref 0.40–1.50)
GFR: 82.72 mL/min
Glucose, Bld: 88 mg/dL (ref 70–99)
Potassium: 4.6 meq/L (ref 3.5–5.1)
Sodium: 140 meq/L (ref 135–145)
Total Bilirubin: 0.6 mg/dL (ref 0.2–1.2)
Total Protein: 7.2 g/dL (ref 6.0–8.3)

## 2024-02-19 LAB — CBC WITH DIFFERENTIAL/PLATELET
Basophils Absolute: 0.1 10*3/uL (ref 0.0–0.1)
Basophils Relative: 1.2 % (ref 0.0–3.0)
Eosinophils Absolute: 0.1 10*3/uL (ref 0.0–0.7)
Eosinophils Relative: 1.3 % (ref 0.0–5.0)
HCT: 47 % (ref 39.0–52.0)
Hemoglobin: 15.6 g/dL (ref 13.0–17.0)
Lymphocytes Relative: 38.5 % (ref 12.0–46.0)
Lymphs Abs: 2.4 10*3/uL (ref 0.7–4.0)
MCHC: 33.2 g/dL (ref 30.0–36.0)
MCV: 93.5 fl (ref 78.0–100.0)
Monocytes Absolute: 0.6 10*3/uL (ref 0.1–1.0)
Monocytes Relative: 9.5 % (ref 3.0–12.0)
Neutro Abs: 3.1 10*3/uL (ref 1.4–7.7)
Neutrophils Relative %: 49.5 % (ref 43.0–77.0)
Platelets: 312 10*3/uL (ref 150.0–400.0)
RBC: 5.02 Mil/uL (ref 4.22–5.81)
RDW: 13.8 % (ref 11.5–15.5)
WBC: 6.2 10*3/uL (ref 4.0–10.5)

## 2024-02-19 LAB — URINALYSIS
Bilirubin Urine: NEGATIVE
Hgb urine dipstick: NEGATIVE
Ketones, ur: NEGATIVE
Leukocytes,Ua: NEGATIVE
Nitrite: NEGATIVE
Specific Gravity, Urine: 1.01 (ref 1.000–1.030)
Total Protein, Urine: NEGATIVE
Urine Glucose: NEGATIVE
Urobilinogen, UA: 0.2 (ref 0.0–1.0)
pH: 6 (ref 5.0–8.0)

## 2024-02-19 LAB — VITAMIN D 25 HYDROXY (VIT D DEFICIENCY, FRACTURES): VITD: 22.17 ng/mL — ABNORMAL LOW (ref 30.00–100.00)

## 2024-02-19 LAB — PSA: PSA: 0.81 ng/mL (ref 0.10–4.00)

## 2024-02-19 LAB — VITAMIN B12: Vitamin B-12: 824 pg/mL (ref 211–911)

## 2024-02-19 LAB — TSH: TSH: 2.51 u[IU]/mL (ref 0.35–5.50)

## 2024-02-19 MED ORDER — FINASTERIDE 5 MG PO TABS: 5.0000 mg | ORAL_TABLET | Freq: Every day | ORAL | 3 refills | Status: AC

## 2024-02-19 MED ORDER — OLMESARTAN MEDOXOMIL 20 MG PO TABS: 20.0000 mg | ORAL_TABLET | Freq: Every day | ORAL | 3 refills | Status: AC

## 2024-02-19 MED ORDER — HYDROCODONE BIT-HOMATROP MBR 5-1.5 MG/5ML PO SOLN: 5.0000 mL | Freq: Three times a day (TID) | ORAL | 0 refills | Status: AC | PRN

## 2024-02-19 MED ORDER — AMLODIPINE BESYLATE 5 MG PO TABS: 5.0000 mg | ORAL_TABLET | Freq: Every day | ORAL | 3 refills | Status: AC

## 2024-02-19 MED ORDER — FLUTICASONE FUROATE-VILANTEROL 100-25 MCG/ACT IN AEPB: 1.0000 | INHALATION_SPRAY | Freq: Every day | RESPIRATORY_TRACT | 0 refills | Status: AC

## 2024-02-19 NOTE — Assessment & Plan Note (Addendum)
 Start with Breo daily.  CM is a runner.  Cont on Protonix  Hycodan syr  Chest CT w/calcium score

## 2024-02-19 NOTE — Patient Instructions (Signed)
 Recent large-scale observational studies provide strong evidence that the shingles vaccine is associated with a reduced risk of developing dementia. It may also help slow cognitive decline in individuals who already have dementia.  Key Findings from Recent Research Multiple natural experiment studies, which leveraged unique vaccination policies to minimize bias, have consistently found a protective link:  Reduced Risk: One large study, published in [place], analyzed health records from over 280,000 older adults in [place] and found that those who received the live-attenuated shingles vaccine (Zostavax, now discontinued in the [place] ) were approximately 20% less likely to develop dementia over a seven-year follow-up period. Slower Progression: A follow-up study published in Cell indicated that for those already living with dementia, the vaccine appeared to slow the progression of the disease and reduced dementia-related deaths by nearly half over nine years. Vascular Dementia Protection: Other research presented at [facility] 2025 indicated that the vaccine lowered the risk of vascular dementia by 50%. Newer Vaccine: A separate study published in [facility] suggested that the newer, more effective recombinant vaccine (Shingrix, currently used in the [place] ) also offers significant protection against dementia, with an association of lower risk than the older vaccine type. Stronger Effect in Women: Several studies noted that the protective effect against dementia was more pronounced in women than in men, possibly due to differences in immune responses or dementia pathogenesis.  Why Might the Vaccine Help? Researchers are still working to determine the exact mechanism, but current theories center on the idea that preventing the shingles virus (varicella-zoster) from reactivating helps protect the brain:  Reduced Inflammation: The most likely explanation is that the vaccine prevents shingles infections and subsequent  inflammation in the nervous system, which is a known risk factor for neurodegenerative diseases like dementia. Immune System Modulation: It's also possible that the vaccine boosts the immune system more broadly, helping the body combat other processes related to cognitive decline.  Important Considerations Observational Data: While the evidence is strong, these findings primarily come from observational studies, which show an association, not a definitive cause-and-effect relationship. A large-scale randomized controlled trial is the gold standard for conclusive proof, but such trials are difficult to conduct for dementia due to the time and cost involved. Public Health Recommendation: The [place].S. Centers for Disease Control and Prevention (CDC) already recommends the two-dose Shingrix vaccine for all healthy adults aged 15 and older primarily to prevent shingles and its painful complications. The potential added benefit for dementia prevention provides another compelling reason to get vaccinated.    Cardiac CT calcium scoring test $99    Computed tomography, more commonly known as a CT or CAT scan, is a diagnostic medical imaging test. Like traditional x-rays, it produces multiple images or pictures of the inside of the body. The cross-sectional images generated during a CT scan can be reformatted in multiple planes. They can even generate three-dimensional images. These images can be viewed on a computer monitor. CT images of internal organs, bones, soft tissue and blood vessels provide greater detail than traditional x-rays, particularly of soft tissues and blood vessels. A cardiac CT scan for coronary calcium is a non-invasive way of obtaining information about the presence, location and extent of calcified plaque in the coronary arteries--the vessels that supply oxygen-containing blood to the heart muscle. Calcified plaque results when there is a build-up of fat and other substances under the inner  layer of the artery. This material can calcify which signals the presence of atherosclerosis, a disease of the vessel wall, also called coronary  artery disease (CAD). People with this disease have an increased risk for heart attacks. In addition, over time, progression of plaque build up (CAD) can narrow the arteries or even close off blood flow to the heart. The result may be chest pain, sometimes called angina, or a heart attack. Because calcium is a marker of CAD, the amount of calcium detected on a cardiac CT scan is a helpful prognostic tool. The findings on cardiac CT are expressed as a calcium score. Another name for this test is coronary artery calcium scoring.  What are some common uses of the procedure? The goal of cardiac CT scan for calcium scoring is to determine if CAD is present and to what extent, even if there are no symptoms. It is a screening study that may be recommended by a physician for patients with risk factors for CAD but no clinical symptoms. The major risk factors for CAD are: high blood cholesterol levels  family history of heart attacks  diabetes  high blood pressure  cigarette smoking  overweight or obese  physical inactivity   A negative cardiac CT scan for calcium scoring shows no calcification within the coronary arteries. This suggests that CAD is absent or so minimal it cannot be seen by this technique. The chance of having a heart attack over the next two to five years is very low under these circumstances. A positive test means that CAD is present, regardless of whether or not the patient is experiencing any symptoms. The amount of calcification--expressed as the calcium score--may help to predict the likelihood of a myocardial infarction (heart attack) in the coming years and helps your medical doctor or cardiologist decide whether the patient may need to take preventive medicine or undertake other measures such as diet and exercise to lower the risk for heart  attack. The extent of CAD is graded according to your calcium score:  Calcium Score  Presence of CAD (coronary artery disease)  0 No evidence of CAD   1-10 Minimal evidence of CAD  11-100 Mild evidence of CAD  101-400 Moderate evidence of CAD  Over 400 Extensive evidence of CAD   Coronary artery calcium (CAC) score is a strong predictor of incident coronary heart disease (CHD) and provides predictive information beyond traditional risk factors. CAC scoring is reasonable to use in the decision to withhold, postpone, or initiate statin therapy in intermediate-risk or selected borderline-risk asymptomatic adults (age 65-75 years and LDL-C >=70 to <190 mg/dL) who do not have diabetes or established atherosclerotic cardiovascular disease (ASCVD).* In intermediate-risk (10-year ASCVD risk >=7.5% to <20%) adults or selected borderline-risk (10-year ASCVD risk >=5% to <7.5%) adults in whom a CAC score is measured for the purpose of making a treatment decision the following recommendations have been made:   If CAC=0, it is reasonable to withhold statin therapy and reassess in 5 to 10 years, as long as higher risk conditions are absent (diabetes mellitus, family history of premature CHD in first degree relatives (males <55 years; females <65 years), cigarette smoking, or LDL >=190 mg/dL).   If CAC is 1 to 99, it is reasonable to initiate statin therapy for patients >=55 years of age.   If CAC is >=100 or >=75th percentile, it is reasonable to initiate statin therapy at any age.   Cardiology referral should be considered for patients with CAC scores >=400 or >=75th percentile.   *2018 AHA/ACC/AACVPR/AAPA/ABC/ACPM/ADA/AGS/APhA/ASPC/NLA/PCNA Guideline on the Management of Blood Cholesterol: A Report of the American College of Cardiology/American Heart Association  Task Force on Clinical Practice Guidelines. J Am Coll Cardiol. 2019;73(24):3168-3209.

## 2024-02-19 NOTE — Progress Notes (Signed)
 "  Subjective:  Patient ID: Andrew Rowland, male    DOB: 1960-06-28  Age: 64 y.o. MRN: [id number]  CC: Annual Exam (Annual Exam)   HPI Sandeep SAYEED WEATHERALL presents for a well exam F/u HTN, GERD, cough - spastic x 2-3 weeks   Outpatient Medications Prior to Visit  Medication Sig Dispense Refill   famotidine  (PEPCID ) 40 MG tablet Take 1 tablet (40 mg total) by mouth daily. 90 tablet 1   pantoprazole  (PROTONIX ) 40 MG tablet TAKE 1 TABLET BY MOUTH EVERY DAY 90 tablet 3   amLODipine  (NORVASC ) 5 MG tablet TAKE 1 TABLET (5 MG TOTAL) BY MOUTH DAILY. 90 tablet 3   finasteride  (PROSCAR ) 5 MG tablet TAKE 1 TABLET (5 MG TOTAL) BY MOUTH DAILY. 90 tablet 3   fluticasone  furoate-vilanterol (BREO ELLIPTA ) 100-25 MCG/ACT AEPB Inhale 1 puff into the lungs daily. 60 each 0   olmesartan  (BENICAR ) 20 MG tablet TAKE 1 TABLET BY MOUTH EVERY DAY 90 tablet 3   No facility-administered medications prior to visit.    ROS: Review of Systems  Constitutional:  Negative for appetite change, fatigue and unexpected weight change.  HENT:  Negative for congestion, nosebleeds, sneezing, sore throat and trouble swallowing.   Eyes:  Negative for itching and visual disturbance.  Respiratory:  Positive for cough.   Cardiovascular:  Negative for chest pain, palpitations and leg swelling.  Gastrointestinal:  Negative for abdominal distention, blood in stool, diarrhea and nausea.  Genitourinary:  Negative for frequency and hematuria.  Musculoskeletal:  Negative for back pain, gait problem, joint swelling and neck pain.  Skin:  Negative for rash.  Neurological:  Negative for dizziness, tremors, speech difficulty and weakness.  Psychiatric/Behavioral:  Negative for agitation, dysphoric mood and sleep disturbance. The patient is not nervous/anxious.     Objective:  BP 110/82   Pulse 82   Ht 6' 1 (1.854 m)   Wt 228 lb 9.6 oz (103.7 kg)   SpO2 99%   BMI 30.16 kg/m   BP Readings from Last 3 Encounters:  02/19/24 110/82   02/11/23 110/80  12/26/22 90/60    Wt Readings from Last 3 Encounters:  02/19/24 228 lb 9.6 oz (103.7 kg)  02/11/23 237 lb (107.5 kg)  12/26/22 239 lb 3.2 oz (108.5 kg)    Physical Exam Constitutional:      General: He is not in acute distress.    Appearance: He is well-developed. He is obese.     Comments: NAD  Eyes:     Conjunctiva/sclera: Conjunctivae normal.     Pupils: Pupils are equal, round, and reactive to light.  Neck:     Thyroid : No thyromegaly.     Vascular: No JVD.  Cardiovascular:     Rate and Rhythm: Normal rate and regular rhythm.     Heart sounds: Normal heart sounds. No murmur heard.    No friction rub. No gallop.  Pulmonary:     Effort: Pulmonary effort is normal. No respiratory distress.     Breath sounds: Normal breath sounds. No wheezing or rales.  Chest:     Chest wall: No tenderness.  Abdominal:     General: Bowel sounds are normal. There is no distension.     Palpations: Abdomen is soft. There is no mass.     Tenderness: There is no abdominal tenderness. There is no guarding or rebound.  Musculoskeletal:        General: No tenderness. Normal range of motion.     Cervical back: Normal  range of motion.  Lymphadenopathy:     Cervical: No cervical adenopathy.  Skin:    General: Skin is warm and dry.     Findings: No rash.  Neurological:     Mental Status: He is alert and oriented to person, place, and time.     Cranial Nerves: No cranial nerve deficit.     Motor: No abnormal muscle tone.     Coordination: Coordination normal.     Gait: Gait normal.     Deep Tendon Reflexes: Reflexes are normal and symmetric.  Psychiatric:        Behavior: Behavior normal.        Thought Content: Thought content normal.        Judgment: Judgment normal.     Lab Results  Component Value Date   WBC 6.3 02/11/2023   HGB 15.1 02/11/2023   HCT 45.5 02/11/2023   PLT 256.0 02/11/2023   GLUCOSE 92 02/11/2023   CHOL 195 02/11/2023   TRIG 133.0 02/11/2023    HDL 45.30 02/11/2023   LDLCALC 123 (H) 02/11/2023   ALT 15 02/11/2023   AST 16 02/11/2023   NA 138 02/11/2023   K 4.3 02/11/2023   CL 104 02/11/2023   CREATININE 1.06 02/11/2023   BUN 14 02/11/2023   CO2 25 02/11/2023   TSH 2.51 02/11/2023   PSA 0.81 02/11/2023    US  RENAL Result Date: 12/20/2022 CLINICAL DATA:  Follow-up renal cyst EXAM: RENAL / URINARY TRACT ULTRASOUND COMPLETE COMPARISON:  MRI abdomen 05/23/2020 FINDINGS: Right Kidney: Renal measurements: 11.3 x 5.6 x 5.4 cm = volume: 179.4 mL. Normal renal cortical thickness and echogenicity. No hydronephrosis. There is a 1 cm cyst superior pole right kidney. There is a similar-appearing 3.4 cm exophytic cyst lower pole right kidney. No imaging follow-up needed. Left Kidney: Renal measurements: 10.9 x 5.7 x 5.6 cm = volume: 180.8 mL. Echogenicity within normal limits. No mass or hydronephrosis visualized. Bladder: Appears normal for degree of bladder distention. Other: None. IMPRESSION: No hydronephrosis. Electronically Signed   By: Bard Moats M.D.   On: 12/20/2022 09:32    Assessment & Plan:   Problem List Items Addressed This Visit     HTN (hypertension)   Ordered Coronary calcium CT scan      Relevant Medications   amLODipine  (NORVASC ) 5 MG tablet   olmesartan  (BENICAR ) 20 MG tablet   Other Relevant Orders   CT CARDIAC SCORING (SELF PAY ONLY)   Well adult exam - Primary   We discussed age appropriate health related issues, including available/recomended screening tests and vaccinations. Labs were ordered to be later reviewed . All questions were answered. We discussed one or more of the following - seat belt use, use of sunscreen/sun exposure exercise, fall risk reduction, second hand smoke exposure, firearm use and storage, seat belt use, a need for adhering to healthy diet and exercise. Labs were ordered.  All questions were answered. CM had a nl colon 2014, 2022 Dr Charlanne Shingrix discussed - adBreo daily.  CM is a  runner.  Cont on Protonix  CT ca scoring discussed - ordered      Relevant Orders   TSH   Urinalysis   CBC with Differential/Platelet   Lipid panel   PSA   Comprehensive metabolic panel with GFR   Asthmatic bronchitis   Start with Breo daily.  CM is a runner.  Cont on Protonix  Hycodan syr  Chest CT w/calcium score      Relevant Medications  fluticasone  furoate-vilanterol (BREO ELLIPTA ) 100-25 MCG/ACT AEPB   Exercise-induced asthma   2015 mild      Relevant Medications   fluticasone  furoate-vilanterol (BREO ELLIPTA ) 100-25 MCG/ACT AEPB   B12 deficiency   B12 sublingual 5000 mcg daily      Relevant Orders   Vitamin B12   Vitamin D  deficiency   Restart vitamin D       Relevant Orders   VITAMIN D  25 Hydroxy (Vit-D Deficiency, Fractures)   Elevated PSA   PSA         Meds ordered this encounter  Medications   amLODipine  (NORVASC ) 5 MG tablet    Sig: Take 1 tablet (5 mg total) by mouth daily.    Dispense:  90 tablet    Refill:  3   finasteride  (PROSCAR ) 5 MG tablet    Sig: Take 1 tablet (5 mg total) by mouth daily.    Dispense:  90 tablet    Refill:  3   fluticasone  furoate-vilanterol (BREO ELLIPTA ) 100-25 MCG/ACT AEPB    Sig: Inhale 1 puff into the lungs daily.    Dispense:  60 each    Refill:  0   olmesartan  (BENICAR ) 20 MG tablet    Sig: Take 1 tablet (20 mg total) by mouth daily.    Dispense:  90 tablet    Refill:  3   HYDROcodone  bit-homatropine (HYCODAN) 5-1.5 MG/5ML syrup    Sig: Take 5 mLs by mouth every 8 (eight) hours as needed for cough.    Dispense:  240 mL    Refill:  0      Follow-up: Return in about 6 months (around 08/18/2024) for a follow-up visit.  Marolyn Noel, MD "

## 2024-02-19 NOTE — Assessment & Plan Note (Signed)
 We discussed age appropriate health related issues, including available/recomended screening tests and vaccinations. Labs were ordered to be later reviewed . All questions were answered. We discussed one or more of the following - seat belt use, use of sunscreen/sun exposure exercise, fall risk reduction, second hand smoke exposure, firearm use and storage, seat belt use, a need for adhering to healthy diet and exercise. Labs were ordered.  All questions were answered. CM had a nl colon 2014, 2022 Dr Charlanne Shingrix discussed - adBreo daily.  CM is a runner.  Cont on Protonix  CT ca scoring discussed - ordered

## 2024-02-19 NOTE — Assessment & Plan Note (Signed)
 Ordered Coronary calcium CT scan

## 2024-02-19 NOTE — Assessment & Plan Note (Signed)
Restart vitamin D

## 2024-02-19 NOTE — Assessment & Plan Note (Signed)
 2015 mild

## 2024-02-19 NOTE — Assessment & Plan Note (Signed)
 B12 sublingual 5000 mcg daily

## 2024-02-19 NOTE — Assessment & Plan Note (Signed)
 PSA

## 2024-02-26 ENCOUNTER — Other Ambulatory Visit (HOSPITAL_BASED_OUTPATIENT_CLINIC_OR_DEPARTMENT_OTHER)

## 2024-03-13 ENCOUNTER — Encounter: Admitting: Internal Medicine
